# Patient Record
Sex: Female | Born: 2000 | Race: Black or African American | Hispanic: No | Marital: Single | Smoking: Never smoker
Health system: Southern US, Community
[De-identification: ages and names within clinical notes are randomized; demographics above are authoritative.]

## PROBLEM LIST (undated history)

## (undated) DIAGNOSIS — F419 Anxiety disorder, unspecified: Secondary | ICD-10-CM

## (undated) DIAGNOSIS — K219 Gastro-esophageal reflux disease without esophagitis: Secondary | ICD-10-CM

## (undated) DIAGNOSIS — T7840XA Allergy, unspecified, initial encounter: Secondary | ICD-10-CM

## (undated) DIAGNOSIS — F32A Depression, unspecified: Secondary | ICD-10-CM

## (undated) DIAGNOSIS — J302 Other seasonal allergic rhinitis: Secondary | ICD-10-CM

## (undated) DIAGNOSIS — D649 Anemia, unspecified: Secondary | ICD-10-CM

## (undated) DIAGNOSIS — J45909 Unspecified asthma, uncomplicated: Secondary | ICD-10-CM

## (undated) HISTORY — DX: Unspecified asthma, uncomplicated: J45.909

## (undated) HISTORY — DX: Anxiety disorder, unspecified: F41.9

## (undated) HISTORY — DX: Gastro-esophageal reflux disease without esophagitis: K21.9

## (undated) HISTORY — PX: NO PAST SURGERIES: SHX2092

## (undated) HISTORY — DX: Allergy, unspecified, initial encounter: T78.40XA

## (undated) HISTORY — DX: Depression, unspecified: F32.A

## (undated) HISTORY — DX: Anemia, unspecified: D64.9

---
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
## 2020-01-11 ENCOUNTER — Encounter: Payer: Self-pay | Admitting: Family Medicine

## 2020-01-26 DIAGNOSIS — H1045 Other chronic allergic conjunctivitis: Secondary | ICD-10-CM | POA: Diagnosis not present

## 2020-01-26 DIAGNOSIS — J301 Allergic rhinitis due to pollen: Secondary | ICD-10-CM | POA: Diagnosis not present

## 2020-01-26 DIAGNOSIS — J4520 Mild intermittent asthma, uncomplicated: Secondary | ICD-10-CM | POA: Diagnosis not present

## 2020-01-26 DIAGNOSIS — J3081 Allergic rhinitis due to animal (cat) (dog) hair and dander: Secondary | ICD-10-CM | POA: Diagnosis not present

## 2020-02-15 ENCOUNTER — Other Ambulatory Visit: Payer: Self-pay | Admitting: Family Medicine

## 2020-02-15 DIAGNOSIS — B373 Candidiasis of vulva and vagina: Secondary | ICD-10-CM

## 2020-02-15 DIAGNOSIS — B3731 Acute candidiasis of vulva and vagina: Secondary | ICD-10-CM

## 2020-02-15 NOTE — Telephone Encounter (Signed)
Requested medications are due for refill today?  Medication not assigned to a protocol.  Medication not on active medication list.    Requested medications are on active medication list?  No  Last Refill:   Last prescribed 3 months ago by Maurice Small, FNP  Future visit scheduled?  No   Notes to Clinic:

## 2020-02-19 NOTE — Telephone Encounter (Signed)
Please make appointment.

## 2020-02-23 ENCOUNTER — Ambulatory Visit (INDEPENDENT_AMBULATORY_CARE_PROVIDER_SITE_OTHER): Payer: Medicaid Other | Admitting: Family Medicine

## 2020-02-23 ENCOUNTER — Encounter: Payer: Self-pay | Admitting: Family Medicine

## 2020-02-23 ENCOUNTER — Other Ambulatory Visit (HOSPITAL_COMMUNITY)
Admission: RE | Admit: 2020-02-23 | Discharge: 2020-02-23 | Disposition: A | Discharge status: Home / Self Care | Payer: Medicaid Other | Source: Ambulatory Visit | Attending: Family Medicine | Admitting: Family Medicine

## 2020-02-23 ENCOUNTER — Other Ambulatory Visit: Payer: Self-pay

## 2020-02-23 VITALS — BP 122/80 | HR 78 | Temp 98.1°F | Resp 14 | Ht 67.0 in | Wt 150.4 lb

## 2020-02-23 DIAGNOSIS — N3090 Cystitis, unspecified without hematuria: Secondary | ICD-10-CM

## 2020-02-23 DIAGNOSIS — N898 Other specified noninflammatory disorders of vagina: Secondary | ICD-10-CM | POA: Diagnosis not present

## 2020-02-23 LAB — POCT URINALYSIS DIPSTICK
Bilirubin, UA: NEGATIVE
Blood, UA: NEGATIVE
Glucose, UA: NEGATIVE
Ketones, UA: NEGATIVE
Leukocytes, UA: NEGATIVE
Nitrite, UA: NEGATIVE
Odor: NORMAL
Protein, UA: NEGATIVE
Spec Grav, UA: 1.015 (ref 1.010–1.025)
Urobilinogen, UA: 0.2 E.U./dL
pH, UA: 6 (ref 5.0–8.0)

## 2020-02-23 LAB — POCT URINE PREGNANCY: Preg Test, Ur: NEGATIVE

## 2020-02-23 NOTE — Patient Instructions (Signed)
Bacterial Vaginosis  Bacterial vaginosis is a vaginal infection that occurs when the normal balance of bacteria in the vagina is disrupted. It results from an overgrowth of certain bacteria. This is the most common vaginal infection among women ages 15-44. Because bacterial vaginosis increases your risk for STIs (sexually transmitted infections), getting treated can help reduce your risk for chlamydia, gonorrhea, herpes, and HIV (human immunodeficiency virus). Treatment is also important for preventing complications in pregnant women, because this condition can cause an early (premature) delivery. What are the causes? This condition is caused by an increase in harmful bacteria that are normally present in small amounts in the vagina. However, the reason that the condition develops is not fully understood. What increases the risk? The following factors may make you more likely to develop this condition:  Having a new sexual partner or multiple sexual partners.  Having unprotected sex.  Douching.  Having an intrauterine device (IUD).  Smoking.  Drug and alcohol abuse.  Taking certain antibiotic medicines.  Being pregnant. You cannot get bacterial vaginosis from toilet seats, bedding, swimming pools, or contact with objects around you. What are the signs or symptoms? Symptoms of this condition include:  Grey or Springsteen vaginal discharge. The discharge can also be watery or foamy.  A fish-like odor with discharge, especially after sexual intercourse or during menstruation.  Itching in and around the vagina.  Burning or pain with urination. Some women with bacterial vaginosis have no signs or symptoms. How is this diagnosed? This condition is diagnosed based on:  Your medical history.  A physical exam of the vagina.  Testing a sample of vaginal fluid under a microscope to look for a large amount of bad bacteria or abnormal cells. Your health care provider may use a cotton swab or  a small wooden spatula to collect the sample. How is this treated? This condition is treated with antibiotics. These may be given as a pill, a vaginal cream, or a medicine that is put into the vagina (suppository). If the condition comes back after treatment, a second round of antibiotics may be needed. Follow these instructions at home: Medicines  Take over-the-counter and prescription medicines only as told by your health care provider.  Take or use your antibiotic as told by your health care provider. Do not stop taking or using the antibiotic even if you start to feel better. General instructions  If you have a female sexual partner, tell her that you have a vaginal infection. She should see her health care provider and be treated if she has symptoms. If you have a female sexual partner, he does not need treatment.  During treatment: ? Avoid sexual activity until you finish treatment. ? Do not douche. ? Avoid alcohol as directed by your health care provider. ? Avoid breastfeeding as directed by your health care provider.  Drink enough water and fluids to keep your urine clear or pale yellow.  Keep the area around your vagina and rectum clean. ? Wash the area daily with warm water. ? Wipe yourself from front to back after using the toilet.  Keep all follow-up visits as told by your health care provider. This is important. How is this prevented?  Do not douche.  Wash the outside of your vagina with warm water only.  Use protection when having sex. This includes latex condoms and dental dams.  Limit how many sexual partners you have. To help prevent bacterial vaginosis, it is best to have sex with just one partner (  monogamous).  Make sure you and your sexual partner are tested for STIs.  Wear cotton or cotton-lined underwear.  Avoid wearing tight pants and pantyhose, especially during summer.  Limit the amount of alcohol that you drink.  Do not use any products that contain  nicotine or tobacco, such as cigarettes and e-cigarettes. If you need help quitting, ask your health care provider.  Do not use illegal drugs. Where to find more information  Centers for Disease Control and Prevention: www.cdc.gov/std  American Sexual Health Association (ASHA): www.ashastd.org  U.S. Department of Health and Human Services, Office on Women's Health: www.womenshealth.gov/ or https://www.womenshealth.gov/a-z-topics/bacterial-vaginosis Contact a health care provider if:  Your symptoms do not improve, even after treatment.  You have more discharge or pain when urinating.  You have a fever.  You have pain in your abdomen.  You have pain during sex.  You have vaginal bleeding between periods. Summary  Bacterial vaginosis is a vaginal infection that occurs when the normal balance of bacteria in the vagina is disrupted.  Because bacterial vaginosis increases your risk for STIs (sexually transmitted infections), getting treated can help reduce your risk for chlamydia, gonorrhea, herpes, and HIV (human immunodeficiency virus). Treatment is also important for preventing complications in pregnant women, because the condition can cause an early (premature) delivery.  This condition is treated with antibiotic medicines. These may be given as a pill, a vaginal cream, or a medicine that is put into the vagina (suppository). This information is not intended to replace advice given to you by your health care provider. Make sure you discuss any questions you have with your health care provider. Document Revised: 09/20/2017 Document Reviewed: 06/23/2016 Elsevier Patient Education  2020 Elsevier Inc. Vaginitis  Vaginitis is irritation and swelling (inflammation) of the vagina. It happens when normal bacteria and yeast in the vagina grow too much. There are many types of this condition. Treatment will depend on the type you have. Follow these instructions at home: Lifestyle  Keep  your vagina area clean and dry. ? Avoid using soap. ? Rinse the area with water.  Do not do the following until your doctor says it is okay: ? Wash and clean out the vagina (douche). ? Use tampons. ? Have sex.  Wipe from front to back after going to the bathroom.  Let air reach your vagina. ? Wear cotton underwear. ? Do not wear:  Underwear while you sleep.  Tight pants.  Thong underwear.  Underwear or nylons without a cotton panel. ? Take off any wet clothing, such as bathing suits, as soon as possible.  Use gentle, non-scented products. Do not use things that can irritate the vagina, such as fabric softeners. Avoid the following products if they are scented: ? Feminine sprays. ? Detergents. ? Tampons. ? Feminine hygiene products. ? Soaps or bubble baths.  Practice safe sex and use condoms. General instructions  Take over-the-counter and prescription medicines only as told by your doctor.  If you were prescribed an antibiotic medicine, take or use it as told by your doctor. Do not stop taking or using the antibiotic even if you start to feel better.  Keep all follow-up visits as told by your doctor. This is important. Contact a doctor if:  You have pain in your belly.  You have a fever.  Your symptoms last for more than 2-3 days. Get help right away if:  You have a fever and your symptoms get worse all of a sudden. Summary  Vaginitis is irritation and   swelling of the vagina. It can happen when the normal bacteria and yeast in the vagina grow too much. There are many types.  Treatment will depend on the type you have.  Do not douche, use tampons , or have sex until your health care provider approves. When you can return to sex, practice safe sex and use condoms. This information is not intended to replace advice given to you by your health care provider. Make sure you discuss any questions you have with your health care provider. Document Revised: 09/20/2017  Document Reviewed: 10/30/2016 Elsevier Patient Education  2020 Elsevier Inc.  

## 2020-02-23 NOTE — Progress Notes (Signed)
Patient ID: Melinda Snyder, female    DOB: [birth year], 19 y.o.   MRN: [id number]  PCP: Hubbard Hartshorn, FNP  Chief Complaint  Patient presents with  . Vaginal Discharge    onset 3 weeks yellow discharge and odor    Subjective:   Melinda Snyder is a 19 y.o. female, presents to clinic with CC of the following:  HPI   Pt presents for 3 weeks vaginal discharge, discharge volume did not change, discharge changed color turned yellow and had an odor She also had slightly darker urine.    No OTC supplements or AZO  Urine pregnancy and urine dip done today- both negative, pt has no urinary sx, denies fever chills, sweats, N, V, D, back pain, abd pain Results for orders placed or performed in visit on 02/23/20  POCT urinalysis dipstick  Result Value Ref Range   Color, UA yellow    Clarity, UA clear    Glucose, UA Negative Negative   Bilirubin, UA neg    Ketones, UA neg    Spec Grav, UA 1.015 1.010 - 1.025   Blood, UA neg    pH, UA 6.0 5.0 - 8.0   Protein, UA Negative Negative   Urobilinogen, UA 0.2 0.2 or 1.0 E.U./dL   Nitrite, UA neg    Leukocytes, UA Negative Negative   Appearance clear    Odor normal   POCT urine pregnancy  Result Value Ref Range   Preg Test, Ur Negative Negative     Hx and HPI from OV 10/19/2019:  Pt presents with concern for vaginal odor - intermittent for about 5 months now.  She also notes occasional vaginal discharge - yellow and thick at times.  She notes that she tried drinking apple cider vinegar, cranberry juice, avoiding panty liners, and wearing loose clothing to bed did help some.  She has never been sexually active.  She denies any urinary symptoms, no abdominal pain, fevers/chills, N/V.        Patient Active Problem List   Diagnosis Date Noted  . Allergic rhinitis due to allergen 11/21/2018  . Asthma without status asthmaticus 11/21/2018      Current Outpatient Medications:  .  albuterol (PROAIR HFA) 108 (90 Base) MCG/ACT  inhaler, INHALE 2 PUFFS EVERY 4 HOURS AS NEEDED, Disp: , Rfl:  .  azelastine (ASTELIN) 0.1 % nasal spray, 1-2 PUFF IN EACH NOSTRIL TWICE A DAY NASALLY 30 DAY(S), Disp: , Rfl:  .  levocetirizine (XYZAL) 5 MG tablet, TAKE 1 TABLET BY MOUTH EVERY DAY IN THE EVENING, Disp: , Rfl:  .  TRI-LO-MARZIA 0.18/0.215/0.25 MG-25 MCG tab, TAKE 1 TABLET BY MOUTH EVERY DAY, Disp: 84 tablet, Rfl: 3   Allergies  Allergen Reactions  . Grass Extracts [Gramineae Pollens]   . Tree Extract      Family History  Problem Relation Age of Onset  . Hypertension Mother   . Iron deficiency Mother   . Heart disease Father   . Kidney disease Father      Social History   Socioeconomic History  . Marital status: Single    Spouse name: Not on file  . Number of children: Not on file  . Years of education: 66  . Highest education level: Not on file  Occupational History  . Occupation: full time student  Tobacco Use  . Smoking status: Never Smoker  . Smokeless tobacco: Never Used  Substance and Sexual Activity  . Alcohol use: No  . Drug use:  No  . Sexual activity: Not Currently    Partners: Male    Birth control/protection: Pill  Other Topics Concern  . Not on file  Social History Narrative  . Not on file   Social Determinants of Health   Financial Resource Strain:   . Difficulty of Paying Living Expenses:   Food Insecurity:   . Worried About Programme researcher, broadcasting/film/video in the Last Year:   . Barista in the Last Year:   Transportation Needs:   . Freight forwarder (Medical):   Marland Kitchen Lack of Transportation (Non-Medical):   Physical Activity:   . Days of Exercise per Week:   . Minutes of Exercise per Session:   Stress:   . Feeling of Stress :   Social Connections:   . Frequency of Communication with Friends and Family:   . Frequency of Social Gatherings with Friends and Family:   . Attends Religious Services:   . Active Member of Clubs or Organizations:   . Attends Banker  Meetings:   Marland Kitchen Marital Status:   Intimate Partner Violence:   . Fear of Current or Ex-Partner:   . Emotionally Abused:   Marland Kitchen Physically Abused:   . Sexually Abused:     Chart Review Today: I personally reviewed active problem list, medication list, allergies, family history, social history, health maintenance, notes from last encounter, lab results, imaging with the patient/caregiver today.   Review of Systems 10 Systems reviewed and are negative for acute change except as noted in the HPI.     Objective:   Vitals:   02/23/20 0902  BP: 122/80  Pulse: 78  Resp: 14  Temp: 98.1 F (36.7 C)  SpO2: 98%  Weight: 150 lb 6.4 oz (68.2 kg)  Height: 5\' 7"  (1.702 m)    Body mass index is 23.56 kg/m.  Physical Exam  GEN- well appearing, NAD, alert, well developed, well nourished,  HEENT- no conjunctival injection, no scleral icterus, external ears normal b/l Neck- Supple, normal phonation, normal ROM CVS- RRR, no M, G, R, no palpable thrill, symmetrical pulses 2+ radial, no LE edema bilaterally RESP- normal chest expansion, symetrrical, no respiratory distress, no tachypnea, CTA A&P no wheeze, rhonchi, rales ABD- normal appearance, normal BS x 4, abdomen soft NTND, no guarding, no rebound, CVA tenderness  GU- refused MSK- no deformity, no joint swelling, grossly intact ROM EXT- No rash, edema, erythema, wounds, lesions Neuro - Alert, normal gait, no tremor, normal coordination Psych - normal mood, normal affect, good eye contact, normal speech    Results for orders placed or performed in visit on 02/23/20  POCT urinalysis dipstick  Result Value Ref Range   Color, UA yellow    Clarity, UA clear    Glucose, UA Negative Negative   Bilirubin, UA neg    Ketones, UA neg    Spec Grav, UA 1.015 1.010 - 1.025   Blood, UA neg    pH, UA 6.0 5.0 - 8.0   Protein, UA Negative Negative   Urobilinogen, UA 0.2 0.2 or 1.0 E.U./dL   Nitrite, UA neg    Leukocytes, UA Negative Negative    Appearance clear    Odor normal   POCT urine pregnancy  Result Value Ref Range   Preg Test, Ur Negative Negative        Assessment & Plan:    1. Vaginal discharge Scant new discharge over the past couple months, not much volume and no vaginitis sx/irritation.  Doubt  PID, will tx based off lab results and if pt symptomatic. - POCT urinalysis dipstick - POCT urine pregnancy - Cervicovaginal ancillary only  2. Vaginal odor Scant discharge and odor - possibly BV, but not bothersome, discussed physiological vaginal discharge and indications to tx - POCT urinalysis dipstick - POCT urine pregnancy - Cervicovaginal ancillary only  3. Cystitis Mild suprapubic tenderness on exam, no urinary sx and dip neg, check urine culture - Urine Culture     Danelle Berry, PA-C 02/23/20 9:26 AM

## 2020-02-24 LAB — CERVICOVAGINAL ANCILLARY ONLY
Bacterial Vaginitis (gardnerella): NEGATIVE
Candida Glabrata: NEGATIVE
Candida Vaginitis: NEGATIVE
Chlamydia: NEGATIVE
Comment: NEGATIVE
Comment: NEGATIVE
Comment: NEGATIVE
Comment: NEGATIVE
Comment: NEGATIVE
Comment: NORMAL
Neisseria Gonorrhea: NEGATIVE
Trichomonas: NEGATIVE

## 2020-02-24 LAB — URINE CULTURE
MICRO NUMBER:: 10438470
Result:: NO GROWTH
SPECIMEN QUALITY:: ADEQUATE

## 2020-03-17 DIAGNOSIS — Z23 Encounter for immunization: Secondary | ICD-10-CM | POA: Diagnosis not present

## 2020-04-14 DIAGNOSIS — Z23 Encounter for immunization: Secondary | ICD-10-CM | POA: Diagnosis not present

## 2020-04-21 DIAGNOSIS — Z419 Encounter for procedure for purposes other than remedying health state, unspecified: Secondary | ICD-10-CM | POA: Diagnosis not present

## 2020-05-22 DIAGNOSIS — Z419 Encounter for procedure for purposes other than remedying health state, unspecified: Secondary | ICD-10-CM | POA: Diagnosis not present

## 2020-06-11 DIAGNOSIS — Z20822 Contact with and (suspected) exposure to covid-19: Secondary | ICD-10-CM | POA: Diagnosis not present

## 2020-06-11 DIAGNOSIS — K222 Esophageal obstruction: Secondary | ICD-10-CM | POA: Diagnosis not present

## 2020-06-16 NOTE — Progress Notes (Deleted)
Patient is a 19 year old female Last visit here was with Danelle Berry in May Follows up today with migraine headache concerns.  Looking through her chart upon review, she has not been seen recently for headaches or migraine concerns, and is not in her active problem list.

## 2020-06-17 ENCOUNTER — Ambulatory Visit: Payer: Medicaid Other | Admitting: Internal Medicine

## 2020-06-22 DIAGNOSIS — Z419 Encounter for procedure for purposes other than remedying health state, unspecified: Secondary | ICD-10-CM | POA: Diagnosis not present

## 2020-07-22 DIAGNOSIS — Z419 Encounter for procedure for purposes other than remedying health state, unspecified: Secondary | ICD-10-CM | POA: Diagnosis not present

## 2020-08-22 DIAGNOSIS — Z419 Encounter for procedure for purposes other than remedying health state, unspecified: Secondary | ICD-10-CM | POA: Diagnosis not present

## 2020-08-29 DIAGNOSIS — M7522 Bicipital tendinitis, left shoulder: Secondary | ICD-10-CM | POA: Diagnosis not present

## 2020-08-29 DIAGNOSIS — S46212A Strain of muscle, fascia and tendon of other parts of biceps, left arm, initial encounter: Secondary | ICD-10-CM | POA: Diagnosis not present

## 2020-09-07 DIAGNOSIS — H5213 Myopia, bilateral: Secondary | ICD-10-CM | POA: Diagnosis not present

## 2020-09-21 DIAGNOSIS — Z419 Encounter for procedure for purposes other than remedying health state, unspecified: Secondary | ICD-10-CM | POA: Diagnosis not present

## 2020-09-23 DIAGNOSIS — H5203 Hypermetropia, bilateral: Secondary | ICD-10-CM | POA: Diagnosis not present

## 2020-10-22 DIAGNOSIS — Z419 Encounter for procedure for purposes other than remedying health state, unspecified: Secondary | ICD-10-CM | POA: Diagnosis not present

## 2020-11-03 ENCOUNTER — Telehealth: Payer: Self-pay | Admitting: Emergency Medicine

## 2020-11-03 DIAGNOSIS — Z3041 Encounter for surveillance of contraceptive pills: Secondary | ICD-10-CM

## 2020-11-03 MED ORDER — NORGESTIM-ETH ESTRAD TRIPHASIC 0.18/0.215/0.25 MG-25 MCG PO TABS: 1.0000 | ORAL_TABLET | Freq: Every day | ORAL | 1 refills | Status: DC

## 2020-11-04 NOTE — Telephone Encounter (Signed)
lvm for pt to call the office and schedule an appt 

## 2020-11-07 ENCOUNTER — Ambulatory Visit (INDEPENDENT_AMBULATORY_CARE_PROVIDER_SITE_OTHER): Payer: Medicaid Other | Admitting: Internal Medicine

## 2020-11-07 DIAGNOSIS — B349 Viral infection, unspecified: Secondary | ICD-10-CM

## 2020-11-07 NOTE — Progress Notes (Signed)
Name: Melinda Snyder   MRN: [id number]    DOB: 17-Nov-2000   Date:11/07/2020       Progress Note  Subjective  Chief Complaint  Chief Complaint  Patient presents with  . Covid Symptoms    Cough, headache, body aches, chills, and nausea. Onset 11/06/2020.    I connected with  KARSTYN BIRKEY on 11/07/20 at  2:00 PM EST by telephone and verified that I am speaking with the correct person using two identifiers.  I discussed the limitations, risks, security and privacy concerns of performing an evaluation and management service by telephone and the availability of in person appointments. The patient expressed understanding and agreed to proceed. Staff also discussed with the patient that there may be a patient responsible charge related to this service. Patient Location: Home Provider Location: [facility] Additional Individuals present: none  HPI Patient is a 20 y.o female Present with above complaints Had exposures - mom has it, lives at home with her, has 2 sisters with sx's  Has test scheduled for this Thursday at [facility]  Covid imm status - had vaccine X 2, no booster  Sx' started - Sat  + cough, on and off, + production at times and whitish mucus No marked SOB + fever, not take temp, feeling feverish - chills + sore throat.  + mild congestion, +PND - + whitish mucus No loss of smell, loss of taste + N/V, vomited Sat am the last time + muscle aches, + body aches No marked loose stools/diarrhea No CP,  passing out episodes Has tried robitussin, tylenol, allergy med which is zyrtec and singulair   Tob - never smoker Comorbid conditions reviewed + asthma - no daily meds for asthma, has albuterol to use prn, has Advair just prescribed by allergist and uses prn (noted usually told to take daily and not taking like that). Notes asthma not worse presently, no wheezing or SOB  Works at [employer],     Patient Active Problem List   Diagnosis Date Noted  . Allergic rhinitis due to allergen  11/21/2018  . Asthma without status asthmaticus 11/21/2018    Past Surgical History:  Procedure Laterality Date  . NO PAST SURGERIES      Family History  Problem Relation Age of Onset  . Hypertension Mother   . Iron deficiency Mother   . Heart disease Father   . Kidney disease Father     Social History   Tobacco Use  . Smoking status: Never Smoker  . Smokeless tobacco: Never Used  Substance Use Topics  . Alcohol use: No     Current Outpatient Medications:  .  albuterol (VENTOLIN HFA) 108 (90 Base) MCG/ACT inhaler, INHALE 2 PUFFS EVERY 4 HOURS AS NEEDED, Disp: , Rfl:  .  azelastine (ASTELIN) 0.1 % nasal spray, 1-2 PUFF IN EACH NOSTRIL TWICE A DAY NASALLY 30 DAY(S), Disp: , Rfl:  .  levocetirizine (XYZAL) 5 MG tablet, TAKE 1 TABLET BY MOUTH EVERY DAY IN THE EVENING, Disp: , Rfl:  .  Norgestimate-Ethinyl Estradiol Triphasic (TRI-LO-MARZIA) 0.18/0.215/0.25 MG-25 MCG tab, Take 1 tablet by mouth daily., Disp: 84 tablet, Rfl: 1  Allergies  Allergen Reactions  . Grass Extracts [Gramineae Pollens]   . Tree Extract     With staff assistance, above reviewed with the patient today.  ROS: As per HPI, otherwise no specific complaints on a limited and focused system review   Objective  Virtual encounter, vitals not obtained.  There is no height or weight  on file to calculate BMI.  Physical Exam   Appears in NAD via conversation Pulmonary/Chest: No obvious respiratory distress. Speaking in complete sentences Neurological: Pt is alert and oriented, Speech is normal Psychiatric: Patient has a normal mood and affect, behavior is normal. Judgment and thought content normal.   No results found for this or any previous visit (from the past 72 hour(s)).  PHQ2/9: Depression screen [facility] 2/9 11/07/2020 02/23/2020 10/19/2019 06/24/2019 05/05/2019  Decreased Interest 0 0 0 0 0  Down, Depressed, Hopeless 0 0 0 0 0  PHQ - 2 Score 0 0 0 0 0  Altered sleeping - 0 0 0 -  Tired, decreased  energy - 0 1 0 -  Change in appetite - 0 1 0 -  Feeling bad or failure about yourself  - 0 1 0 -  Trouble concentrating - 0 1 0 -  Moving slowly or fidgety/restless - 0 0 0 -  Suicidal thoughts - 0 0 0 -  PHQ-9 Score - 0 4 0 -  Difficult doing work/chores - Not difficult at all - - -   PHQ-2/9 Result reviewed  Fall Risk: Fall Risk  11/07/2020 02/23/2020 10/19/2019 06/24/2019 05/05/2019  Falls in the past year? 0 0 0 0 0  Number falls in past yr: 0 0 0 0 0  Injury with Fall? 0 0 0 0 0  Follow up - - Falls evaluation completed - -     Assessment & Plan  1. Viral syndrome Discussed with patient at length the likelihood that she is positive for COVID noting the exposure history, with mom having COVID and her living in the same household.  She has a test scheduled for Thursday, and noted we may be able to get that sooner through [facility], at the earliest tomorrow afternoon, and will ask our staff to call her if that is a possibility pending staffing that is present due to limitations in getting there with weather issues presently. Noted the treatment options available for COVID presently, although in very limited supply and available almost predominantly to higher risk individuals based on age and comorbidities. Felt best to proceed as follows: Continue with symptomatic measures, and emphasized the importance of staying hydrated, Also continue Tylenol products as needed, and would use a Mucinex or Robitussin type product as an expectorant as needed in the short-term. Recommended she use her Advair inhaler twice daily as is recommended routinely, as can be helpful currently with her COVID illness, as it does have a steroid entity in the medicine in addition to a longer acting form of albuterol.  Also can use her albuterol inhaler as needed in the short-term as well. Discussed return to work issues with her, and she should not return to work until symptoms significantly improved/resolved, and  cannot return to work as we are awaiting the result of a COVID test.  Hoping we can get that test off tomorrow, although did note it does take a couple days at least for that to come back. Noted if her COVID test returns positive, her management likely will not change, and she should not return to work until symptoms have significantly improved/almost resolved, and if the COVID test is positive, she needs to wear a mask for at least 7 days after returning to work.  Should remain isolated until symptoms are much improved.  Also emphasized close monitoring, and if symptoms not improving or more problematic as the week is progressing, she needs to follow-up.  Also noted if  symptoms significantly worsen, with increased shortness of breath, higher fevers, chest pains, more vomiting and or passing out concerns, she needs to be seen more urgently in an ER setting, and she was understanding of that  I discussed the assessment and treatment plan with the patient. The patient was provided an opportunity to ask questions and all were answered. The patient agreed with the plan and demonstrated an understanding of the instructions.  Red flags and when to present for emergency care or RTC including fever >101.14F, chest pain, shortness of breath, new/worsening/un-resolving symptoms reviewed with patient at time of visit.   The patient was advised to call back or seek an in-person evaluation if the symptoms worsen or if the condition fails to improve as anticipated.  I provided 20 minutes of non-face-to-face time during this encounter that included discussing at length patient's sx/history, pertinent pmhx, medications, treatment and follow up plan. This time also included the necessary documentation, orders, and chart review.  Jamelle Haring, MD

## 2020-11-08 ENCOUNTER — Telehealth: Payer: Self-pay

## 2020-11-08 ENCOUNTER — Other Ambulatory Visit: Payer: Self-pay | Admitting: Emergency Medicine

## 2020-11-08 DIAGNOSIS — Z20822 Contact with and (suspected) exposure to covid-19: Secondary | ICD-10-CM

## 2020-11-08 NOTE — Telephone Encounter (Signed)
Spoke with patient to see if she could by the office between 12 and 1 today to be tested for Covid.  Pt stated that she would have to call back once she could figure out her transportation.

## 2020-11-08 NOTE — Telephone Encounter (Signed)
Pt is calling back and will be at office between 12 and 1 pm today

## 2020-11-10 LAB — NOVEL CORONAVIRUS, NAA: SARS-CoV-2, NAA: DETECTED — AB

## 2020-11-10 LAB — SARS-COV-2, NAA 2 DAY TAT

## 2020-11-22 ENCOUNTER — Ambulatory Visit (INDEPENDENT_AMBULATORY_CARE_PROVIDER_SITE_OTHER): Payer: Medicaid Other | Admitting: Family Medicine

## 2020-11-22 ENCOUNTER — Other Ambulatory Visit: Payer: Self-pay

## 2020-11-22 ENCOUNTER — Encounter: Payer: Self-pay | Admitting: Family Medicine

## 2020-11-22 VITALS — BP 110/78 | HR 100 | Temp 98.2°F | Resp 20 | Ht 67.0 in | Wt 147.6 lb

## 2020-11-22 DIAGNOSIS — Z419 Encounter for procedure for purposes other than remedying health state, unspecified: Secondary | ICD-10-CM | POA: Diagnosis not present

## 2020-11-22 DIAGNOSIS — J4520 Mild intermittent asthma, uncomplicated: Secondary | ICD-10-CM

## 2020-11-22 DIAGNOSIS — J302 Other seasonal allergic rhinitis: Secondary | ICD-10-CM

## 2020-11-22 DIAGNOSIS — Z3041 Encounter for surveillance of contraceptive pills: Secondary | ICD-10-CM

## 2020-11-22 DIAGNOSIS — N926 Irregular menstruation, unspecified: Secondary | ICD-10-CM

## 2020-11-22 MED ORDER — FLUTICASONE-SALMETEROL 100-50 MCG/DOSE IN AEPB: 1.0000 | INHALATION_SPRAY | Freq: Two times a day (BID) | RESPIRATORY_TRACT | 5 refills | Status: DC

## 2020-11-22 MED ORDER — LEVOCETIRIZINE DIHYDROCHLORIDE 5 MG PO TABS: 5.0000 mg | ORAL_TABLET | Freq: Every evening | ORAL | 1 refills | Status: DC

## 2020-11-22 MED ORDER — MONTELUKAST SODIUM 10 MG PO TABS: 10.0000 mg | ORAL_TABLET | Freq: Every day | ORAL | 1 refills | Status: DC

## 2020-11-22 MED ORDER — AZELASTINE HCL 0.1 % NA SOLN
2.0000 | Freq: Two times a day (BID) | NASAL | 2 refills | Status: DC
Start: 2020-11-22 — End: 2021-02-07

## 2020-11-22 MED ORDER — FLUTICASONE PROPIONATE 50 MCG/ACT NA SUSP: 2.0000 | Freq: Every day | NASAL | 2 refills | Status: DC

## 2020-11-22 NOTE — Progress Notes (Signed)
   11/22/20 1130  Asthma History  Symptoms 0-2 days/week  Nighttime Awakenings 0-2/month  Asthma interference with normal activity Some limitations  SABA use (not for EIB) 0-2 days/wk  Risk: Exacerbations requiring oral systemic steroids 0-1 / year  Asthma Severity Mild Persistent

## 2020-11-22 NOTE — Progress Notes (Signed)
Name: Melinda Snyder   MRN: [id number]    DOB: 07/04/2001   Date:11/22/2020       Progress Note  Chief Complaint  Patient presents with  . Follow-up     Subjective:   Melinda Snyder is a 20 y.o. female, presents to clinic for routine f/up  Asthma and allergies- managing with astelin, xyzal, advair (but taking prn) and using rescue inhaler In the last couple weeks she restarted advair BID, has not been using rescue inhaler often, gets symptomatic at work in [employer] and when doing more physical work, no nighttime waking No longer seeing allergy/asthma specialists In the past singulair really helped with both allergies and asthma - not currently taking, no SE or concerns in the past She ran out of nasal sprays at the pharmacy was on two - asteline and flonase   11/22/20 1130  Asthma History  Symptoms 0-2 days/week  Nighttime Awakenings 0-2/month  Asthma interference with normal activity Some limitations  SABA use (not for EIB) 0-2 days/wk  Risk: Exacerbations requiring oral systemic steroids 0-1 / year  Asthma Severity Mild Persistent   Sx with exercise/physical activity, some limitations, no waking, no recent exacerbation or need for steroids She thinks she is on zyrtec and not xyzal  Recently had COVID URI sx, sx started on 11/05/2020, tested positive on 1/18, isolation would have ended about a week ago.  All sx resolved, most of her family was ill too and have all seemed to recover  Pt here also for OCP f/up/monitoring On OCP pill On same triphasic birth control pill for several years, started as a teenager at [facility] and established care here 11/21/2018 with Maurice Small, on OCP for helping with irregular menses.  She has normal cycles, bleeding is light, no SE or concerns with OCP, no heavy bleeding, cramping or break through bleeding, good compliance, no missed doses, not currently sexually active.  No hx of clotting disorders, nonsmoker, no hx of  HTN     Current Outpatient Medications:  .  albuterol (VENTOLIN HFA) 108 (90 Base) MCG/ACT inhaler, INHALE 2 PUFFS EVERY 4 HOURS AS NEEDED, Disp: , Rfl:  .  azelastine (ASTELIN) 0.1 % nasal spray, 1-2 PUFF IN EACH NOSTRIL TWICE A DAY NASALLY 30 DAY(S), Disp: , Rfl:  .  levocetirizine (XYZAL) 5 MG tablet, TAKE 1 TABLET BY MOUTH EVERY DAY IN THE EVENING, Disp: , Rfl:  .  Norgestimate-Ethinyl Estradiol Triphasic (TRI-LO-MARZIA) 0.18/0.215/0.25 MG-25 MCG tab, Take 1 tablet by mouth daily., Disp: 84 tablet, Rfl: 1  Patient Active Problem List   Diagnosis Date Noted  . Allergic rhinitis due to allergen 11/21/2018  . Asthma without status asthmaticus 11/21/2018    Past Surgical History:  Procedure Laterality Date  . NO PAST SURGERIES      Family History  Problem Relation Age of Onset  . Hypertension Mother   . Iron deficiency Mother   . Heart disease Father   . Kidney disease Father     Social History   Tobacco Use  . Smoking status: Never Smoker  . Smokeless tobacco: Never Used  Vaping Use  . Vaping Use: Never used  Substance Use Topics  . Alcohol use: No  . Drug use: No     Allergies  Allergen Reactions  . Grass Extracts [Gramineae Pollens]   . Tree Extract     Health Maintenance  Topic Date Due  . Hepatitis C Screening  Never done  . TETANUS/TDAP  Never done  .  INFLUENZA VACCINE  05/22/2020  . HIV Screening  Completed    Chart Review Today: I personally reviewed active problem list, medication list, allergies, family history, social history, health maintenance, notes from last encounter, lab results, imaging with the patient/caregiver today.   Review of Systems  Constitutional: Negative.   HENT: Negative.   Eyes: Negative.   Respiratory: Negative.   Cardiovascular: Negative.  Negative for chest pain, palpitations and leg swelling.  Gastrointestinal: Negative.   Endocrine: Negative.   Genitourinary: Negative.   Musculoskeletal: Negative.   Skin:  Negative.   Allergic/Immunologic: Negative.   Neurological: Negative.   Hematological: Negative.   Psychiatric/Behavioral: Negative.   All other systems reviewed and are negative.    Objective:   Vitals:   11/22/20 1115  BP: 110/78  Pulse: 100  Resp: 20  Temp: 98.2 F (36.8 C)  TempSrc: Oral  SpO2: 99%  Weight: 147 lb 9.6 oz (67 kg)  Height: 5\' 7"  (1.702 m)    Body mass index is 23.12 kg/m.  Physical Exam Vitals and nursing note reviewed.  Constitutional:      General: She is not in acute distress.    Appearance: Normal appearance. She is well-developed and normal weight. She is not ill-appearing, toxic-appearing or diaphoretic.     Interventions: Face mask in place.  HENT:     Head: Normocephalic and atraumatic.     Right Ear: External ear normal.     Left Ear: External ear normal.  Eyes:     General: Lids are normal. No scleral icterus.       Right eye: No discharge.        Left eye: No discharge.     Conjunctiva/sclera: Conjunctivae normal.  Neck:     Trachea: Phonation normal. No tracheal deviation.  Cardiovascular:     Rate and Rhythm: Normal rate and regular rhythm.     Pulses: Normal pulses.          Radial pulses are 2+ on the right side and 2+ on the left side.       Posterior tibial pulses are 2+ on the right side and 2+ on the left side.     Heart sounds: Normal heart sounds. No murmur heard. No friction rub. No gallop.   Pulmonary:     Effort: Pulmonary effort is normal. No respiratory distress.     Breath sounds: Normal breath sounds. No stridor. No wheezing, rhonchi or rales.  Chest:     Chest wall: No tenderness.  Abdominal:     General: Bowel sounds are normal. There is no distension.     Palpations: Abdomen is soft.  Musculoskeletal:     Right lower leg: No edema.     Left lower leg: No edema.  Skin:    General: Skin is warm and dry.     Coloration: Skin is not jaundiced or pale.     Findings: No rash.  Neurological:     Mental  Status: She is alert.     Motor: No abnormal muscle tone.     Gait: Gait normal.  Psychiatric:        Mood and Affect: Mood normal.        Speech: Speech normal.        Behavior: Behavior normal.         Assessment & Plan:     ICD-10-CM   1. Mild intermittent asthma without status asthmaticus without complication  J45.20    continue advair BID, allergy meds,  add back singulair, continue albuterol rescue inhaler PRN and before any exercise   2. Seasonal allergic rhinitis, unspecified trigger  J30.2    continue daily antihistamine, add back singulair, continue steroid and antihistamine nasal sprays  3. Encounter for surveillance of contraceptive pills  Z30.41    good compliance, not currently sexually active, pt low risk, discussed SE and monitoring, BP at goal today, due for CPE, refused STD screening today  4. Irregular menses  N92.6    well controlled with OCP- she has been on same pill for several years, no concerns   Due for immunizations but since she just recovered from COVID, pt asked to come back in about 2 weeks for needed vaccinations:  Health Maintenance  Topic Date Due  . INFLUENZA VACCINE  12/06/2020 (Originally 05/22/2020)  . TETANUS/TDAP  12/06/2020 (Originally 02/12/2020)  . Hepatitis C Screening  11/22/2021 (Originally 10/27/2000)  . HIV Screening  Completed      Return in about 6 months (around 05/22/2021) for Annual Physical.   Danelle Berry, PA-C 11/22/20 11:22 AM

## 2020-11-25 ENCOUNTER — Emergency Department
Admission: EM | Admit: 2020-11-25 | Discharge: 2020-11-25 | Disposition: A | Discharge status: Home / Self Care | Payer: Medicaid Other | Attending: Emergency Medicine | Admitting: Emergency Medicine

## 2020-11-25 ENCOUNTER — Emergency Department: Payer: Medicaid Other

## 2020-11-25 ENCOUNTER — Other Ambulatory Visit: Payer: Self-pay

## 2020-11-25 ENCOUNTER — Encounter: Payer: Self-pay | Admitting: Emergency Medicine

## 2020-11-25 DIAGNOSIS — J45909 Unspecified asthma, uncomplicated: Secondary | ICD-10-CM | POA: Diagnosis not present

## 2020-11-25 DIAGNOSIS — Z7951 Long term (current) use of inhaled steroids: Secondary | ICD-10-CM | POA: Insufficient documentation

## 2020-11-25 DIAGNOSIS — N390 Urinary tract infection, site not specified: Secondary | ICD-10-CM | POA: Diagnosis not present

## 2020-11-25 DIAGNOSIS — M25551 Pain in right hip: Secondary | ICD-10-CM | POA: Diagnosis not present

## 2020-11-25 LAB — URINALYSIS, COMPLETE (UACMP) WITH MICROSCOPIC
Bacteria, UA: NONE SEEN
Bilirubin Urine: NEGATIVE
Glucose, UA: NEGATIVE mg/dL
Hgb urine dipstick: NEGATIVE
Ketones, ur: 5 mg/dL — AB
Nitrite: NEGATIVE
Protein, ur: 100 mg/dL — AB
Specific Gravity, Urine: 1.041 — ABNORMAL HIGH (ref 1.005–1.030)
pH: 5 (ref 5.0–8.0)

## 2020-11-25 MED ORDER — NAPROXEN 500 MG PO TABS: 500.0000 mg | ORAL_TABLET | Freq: Two times a day (BID) | ORAL | 0 refills | Status: DC

## 2020-11-25 MED ORDER — KETOROLAC TROMETHAMINE 30 MG/ML IJ SOLN
30.0000 mg | Freq: Once | INTRAMUSCULAR | Status: AC
  Administered 2020-11-25: 30 mg via INTRAMUSCULAR
  Filled 2020-11-25: qty 1

## 2020-11-25 MED ORDER — NITROFURANTOIN MONOHYD MACRO 100 MG PO CAPS: 100.0000 mg | ORAL_CAPSULE | Freq: Two times a day (BID) | ORAL | 0 refills | Status: AC

## 2020-11-25 NOTE — ED Provider Notes (Signed)
[facility] Emergency Department Provider Note  ____________________________________________   Event Date/Time   First MD Initiated Contact with Patient 11/25/20 [phone]     (approximate)  I have reviewed the triage vital signs and the nursing notes.   HISTORY  Chief Complaint Leg Pain   HPI Melinda Snyder is a 20 y.o. female presents to the ED with complaint of right hip pain that started yesterday without known history of injury.  Patient states that she currently works for [employer] and does a lot of lifting but does not recall an injury.  She has been taking Tylenol and ibuprofen without any relief of her pain.  She denies any previous injury to her hip area.  She states that pain is present with weightbearing but not with sitting or lying supine.  Currently she rates her pain as 6 out of 10.       Past Medical History:  Diagnosis Date  . Allergy   . Seasonal allergies     Patient Active Problem List   Diagnosis Date Noted  . Allergic rhinitis due to allergen 11/21/2018  . Asthma without status asthmaticus 11/21/2018    Past Surgical History:  Procedure Laterality Date  . NO PAST SURGERIES      Prior to Admission medications   Medication Sig Start Date End Date Taking? Authorizing Provider  naproxen (NAPROSYN) 500 MG tablet Take 1 tablet (500 mg total) by mouth 2 (two) times daily with a meal. 11/25/20  Yes Bridget Hartshorn L, PA-C  nitrofurantoin, macrocrystal-monohydrate, (MACROBID) 100 MG capsule Take 1 capsule (100 mg total) by mouth 2 (two) times daily for 7 days. 11/25/20 12/02/20 Yes Summers, Rhonda L, PA-C  albuterol (VENTOLIN HFA) 108 (90 Base) MCG/ACT inhaler INHALE 2 PUFFS EVERY 4 HOURS AS NEEDED 12/13/15   [provider]  azelastine (ASTELIN) 0.1 % nasal spray Place 2 sprays into both nostrils 2 (two) times daily. Use in each nostril as directed 11/22/20   Danelle Berry, PA-C  fluticasone (FLONASE) 50 MCG/ACT nasal spray Place 2 sprays  into both nostrils daily. 11/22/20   Danelle Berry, PA-C  Fluticasone-Salmeterol (ADVAIR DISKUS) 100-50 MCG/DOSE AEPB Inhale 1 puff into the lungs in the morning and at bedtime. 11/22/20   Danelle Berry, PA-C  levocetirizine (XYZAL) 5 MG tablet Take 1 tablet (5 mg total) by mouth every evening. 11/22/20   Danelle Berry, PA-C  montelukast (SINGULAIR) 10 MG tablet Take 1 tablet (10 mg total) by mouth at bedtime. 11/22/20   Danelle Berry, PA-C  Norgestimate-Ethinyl Estradiol Triphasic (TRI-LO-MARZIA) 0.18/0.215/0.25 MG-25 MCG tab Take 1 tablet by mouth daily. 11/03/20   Danelle Berry, PA-C    Allergies Grass extracts [gramineae pollens] and Tree extract  Family History  Problem Relation Age of Onset  . Hypertension Mother   . Iron deficiency Mother   . Heart disease Father   . Kidney disease Father     Social History Social History   Tobacco Use  . Smoking status: Never Smoker  . Smokeless tobacco: Never Used  Vaping Use  . Vaping Use: Never used  Substance Use Topics  . Alcohol use: No  . Drug use: No    Review of Systems Constitutional: No fever/chills Eyes: No visual changes. ENT: No sore throat. Cardiovascular: Denies chest pain. Respiratory: Denies shortness of breath. Gastrointestinal: No abdominal pain.  No nausea, no vomiting.  No diarrhea. Genitourinary: Negative for dysuria. Musculoskeletal: Positive right hip pain. Skin: Negative for rash. Neurological: Negative for headaches, focal weakness  or numbness. ___________________________________________   PHYSICAL EXAM:  VITAL SIGNS: ED Triage Vitals  Enc Vitals Group     BP 11/25/20 0706 116/62     Pulse Rate 11/25/20 0706 79     Resp 11/25/20 0706 16     Temp 11/25/20 0706 98.6 F (37 C)     Temp Source 11/25/20 0706 Oral     SpO2 11/25/20 0706 98 %     Weight 11/25/20 0707 147 lb (66.7 kg)     Height 11/25/20 0707 5\' 7"  (1.702 m)     Head Circumference --      Peak Flow --      Pain Score 11/25/20 0711 6     Pain  Loc --      Pain Edu? --      Excl. in GC? --     Constitutional: Alert and oriented. Well appearing and in no acute distress. Eyes: Conjunctivae are normal. PERRL. EOMI. Head: Atraumatic. Neck: No stridor.   Cardiovascular: Normal rate, regular rhythm. Grossly normal heart sounds.  Good peripheral circulation. Respiratory: Normal respiratory effort.  No retractions. Lungs CTAB. Gastrointestinal: Soft and nontender. No distention. No CVA tenderness. Musculoskeletal: No point tenderness is noted on palpation of the thoracic or lumbar spine.  No sacral tenderness.  Patient has some tenderness on palpation of the right hip laterally.  Range of motion does not reproduce her pain but is reproduced with standing.  No warmth or discoloration noted in the area.  No soft tissue edema present. Neurologic:  Normal speech and language. No gross focal neurologic deficits are appreciated.  Skin:  Skin is warm, dry and intact.  No abrasions or erythema present. Psychiatric: Mood and affect are normal. Speech and behavior are normal.  ____________________________________________   LABS (all labs ordered are listed, but only abnormal results are displayed)  Labs Reviewed  URINALYSIS, COMPLETE (UACMP) WITH MICROSCOPIC - Abnormal; Notable for the following components:      Result Value   Color, Urine AMBER (*)    APPearance CLOUDY (*)    Specific Gravity, Urine 1.041 (*)    Ketones, ur 5 (*)    Protein, ur 100 (*)    Leukocytes,Ua SMALL (*)    All other components within normal limits  POC URINE PREG, ED    RADIOLOGY I, 01/23/21, personally viewed and evaluated these images (plain radiographs) as part of my medical decision making, as well as reviewing the written report by the radiologist.   Official radiology report(s): DG Hip Unilat W or Wo Pelvis 2-3 Views Right  Result Date: 11/25/2020 CLINICAL DATA:  Right hip pain without known injury. EXAM: DG HIP (WITH OR WITHOUT PELVIS) 2-3V  RIGHT COMPARISON:  None. FINDINGS: There is no evidence of hip fracture or dislocation. There is no evidence of arthropathy or other focal bone abnormality. IMPRESSION: Negative. Electronically Signed   By: 01/23/2021 M.D.   On: 11/25/2020 08:57    ____________________________________________   PROCEDURES  Procedure(s) performed (including Critical Care):  Procedures   ____________________________________________   INITIAL IMPRESSION / ASSESSMENT AND PLAN / ED COURSE  As part of my medical decision making, I reviewed the following data within the electronic MEDICAL RECORD NUMBER Notes from prior ED visits and [place] Controlled Substance Database  20 year old female presents to the ED with complaint of right hip pain that began yesterday.  Patient states that there is been no injury.  She currently works at [employer] but no injury occurred  there.  Urinalysis was suspicious for a early UTI.  X-rays of the right hip were negative for any acute bony changes.  Patient has been taking Tylenol and ibuprofen at home home without any relief.  She was given Toradol 30 mg IM and a prescription for naproxen 500 mg twice daily with food.  Also prescription for Macrodantin was sent to her pharmacy twice daily for the next 7 days.  Patient is encouraged to increase fluids.  She is to follow-up with her PCP if any continued problems.  ____________________________________________   FINAL CLINICAL IMPRESSION(S) / ED DIAGNOSES  Final diagnoses:  Acute hip pain, right  Acute urinary tract infection     ED Discharge Orders         Ordered    nitrofurantoin, macrocrystal-monohydrate, (MACROBID) 100 MG capsule  2 times daily        11/25/20 0931    naproxen (NAPROSYN) 500 MG tablet  2 times daily with meals        11/25/20 0931          *Please note:  HAILLEY BYERS was evaluated in Emergency Department on 11/25/2020 for the symptoms described in the history of present illness. She was  evaluated in the context of the global COVID-19 pandemic, which necessitated consideration that the patient might be at risk for infection with the SARS-CoV-2 virus that causes COVID-19. Institutional protocols and algorithms that pertain to the evaluation of patients at risk for COVID-19 are in a state of rapid change based on information released by regulatory bodies including the CDC and federal and state organizations. These policies and algorithms were followed during the patient's care in the ED.  Some ED evaluations and interventions may be delayed as a result of limited staffing during and the pandemic.*   Note:  This document was prepared using Dragon voice recognition software and may include unintentional dictation errors.    Tommi Rumps, PA-C 11/25/20 1319    Gilles Chiquito, MD 11/25/20 [phone]

## 2020-11-25 NOTE — ED Notes (Signed)
POC  Urine preg  Negative

## 2020-11-25 NOTE — ED Notes (Signed)
See triage note  Presents with pain from right hip area into right leg  States pain started yesterday  Denies any injury  Ambulates well  No urinary sx's

## 2020-11-25 NOTE — Discharge Instructions (Addendum)
Follow-up with your primary care provider if any continued problems.  Increase fluids.  Begin taking antibiotics until completely finished.  Be aware that while taking an antibiotic that birth control is less effective and you may want to use other forms of contraceptives while taking the antibiotic until your next.  Take naproxen twice a day with food.  You may take Tylenol with this medication if additional pain medication is needed.

## 2020-11-25 NOTE — ED Triage Notes (Signed)
Pt to ED via POV stating that she is having right leg pain that started last night around 1800. Pt denies any known injury to the area. Pt is in NAD.

## 2020-12-10 ENCOUNTER — Other Ambulatory Visit: Payer: Self-pay

## 2020-12-10 ENCOUNTER — Emergency Department
Admission: EM | Admit: 2020-12-10 | Discharge: 2020-12-10 | Disposition: A | Discharge status: Home / Self Care | Payer: Medicaid Other | Attending: Emergency Medicine | Admitting: Emergency Medicine

## 2020-12-10 DIAGNOSIS — R109 Unspecified abdominal pain: Secondary | ICD-10-CM | POA: Insufficient documentation

## 2020-12-10 DIAGNOSIS — Z5321 Procedure and treatment not carried out due to patient leaving prior to being seen by health care provider: Secondary | ICD-10-CM | POA: Diagnosis not present

## 2020-12-10 DIAGNOSIS — K5900 Constipation, unspecified: Secondary | ICD-10-CM | POA: Diagnosis not present

## 2020-12-10 DIAGNOSIS — R1031 Right lower quadrant pain: Secondary | ICD-10-CM | POA: Diagnosis not present

## 2020-12-10 DIAGNOSIS — R1084 Generalized abdominal pain: Secondary | ICD-10-CM | POA: Diagnosis not present

## 2020-12-10 LAB — COMPREHENSIVE METABOLIC PANEL
ALT: 11 U/L (ref 0–44)
AST: 14 U/L — ABNORMAL LOW (ref 15–41)
Albumin: 3.9 g/dL (ref 3.5–5.0)
Alkaline Phosphatase: 59 U/L (ref 38–126)
Anion gap: 8 (ref 5–15)
BUN: 12 mg/dL (ref 6–20)
CO2: 23 mmol/L (ref 22–32)
Calcium: 8.7 mg/dL — ABNORMAL LOW (ref 8.9–10.3)
Chloride: 106 mmol/L (ref 98–111)
Creatinine, Ser: 0.82 mg/dL (ref 0.44–1.00)
GFR, Estimated: >60 mL/min (ref >60)
Glucose, Bld: 92 mg/dL (ref 70–99)
Potassium: 3.9 mmol/L (ref 3.5–5.1)
Sodium: 137 mmol/L (ref 135–145)
Total Bilirubin: 0.6 mg/dL (ref 0.3–1.2)
Total Protein: 7.4 g/dL (ref 6.5–8.1)

## 2020-12-10 LAB — LIPASE, BLOOD: Lipase: 33 U/L (ref 11–51)

## 2020-12-10 LAB — CBC
HCT: 33.3 % — ABNORMAL LOW (ref 36.0–46.0)
Hemoglobin: 9.7 g/dL — ABNORMAL LOW (ref 12.0–15.0)
MCH: 21 pg — ABNORMAL LOW (ref 26.0–34.0)
MCHC: 29.1 g/dL — ABNORMAL LOW (ref 30.0–36.0)
MCV: 72.2 fL — ABNORMAL LOW (ref 80.0–100.0)
Platelets: 305 10*3/uL (ref 150–400)
RBC: 4.61 MIL/uL (ref 3.87–5.11)
RDW: 17.2 % — ABNORMAL HIGH (ref 11.5–15.5)
WBC: 6 10*3/uL (ref 4.0–10.5)
nRBC: 0 % (ref 0.0–0.2)

## 2020-12-10 LAB — URINALYSIS, COMPLETE (UACMP) WITH MICROSCOPIC
Bacteria, UA: NONE SEEN
Bilirubin Urine: NEGATIVE
Glucose, UA: NEGATIVE mg/dL
Ketones, ur: 5 mg/dL — AB
Leukocytes,Ua: NEGATIVE
Nitrite: NEGATIVE
Protein, ur: 100 mg/dL — AB
RBC / HPF: >50 RBC/hpf — ABNORMAL HIGH (ref 0–5)
Specific Gravity, Urine: 1.036 — ABNORMAL HIGH (ref 1.005–1.030)
pH: 5 (ref 5.0–8.0)

## 2020-12-10 LAB — POC URINE PREG, ED: Preg Test, Ur: NEGATIVE

## 2020-12-10 NOTE — ED Triage Notes (Signed)
Pt presents to ER c/o mid-abdominal pain that started suddenly around 2200 tonight.  Pt denies doing anything when it started.  Pt denies n/v/d at this time.  Abdomen tender on palpation.  Last BM unknown at this time.

## 2020-12-20 DIAGNOSIS — Z419 Encounter for procedure for purposes other than remedying health state, unspecified: Secondary | ICD-10-CM | POA: Diagnosis not present

## 2021-01-20 DIAGNOSIS — Z419 Encounter for procedure for purposes other than remedying health state, unspecified: Secondary | ICD-10-CM | POA: Diagnosis not present

## 2021-01-26 ENCOUNTER — Other Ambulatory Visit: Payer: Self-pay | Admitting: Allergy

## 2021-01-26 ENCOUNTER — Ambulatory Visit
Admission: RE | Admit: 2021-01-26 | Discharge: 2021-01-26 | Disposition: A | Discharge status: Home / Self Care | Payer: Medicaid Other | Source: Ambulatory Visit | Attending: Allergy | Admitting: Allergy

## 2021-01-26 DIAGNOSIS — R0602 Shortness of breath: Secondary | ICD-10-CM | POA: Diagnosis not present

## 2021-01-26 DIAGNOSIS — J301 Allergic rhinitis due to pollen: Secondary | ICD-10-CM | POA: Diagnosis not present

## 2021-01-26 DIAGNOSIS — R059 Cough, unspecified: Secondary | ICD-10-CM | POA: Diagnosis not present

## 2021-01-26 DIAGNOSIS — J45909 Unspecified asthma, uncomplicated: Secondary | ICD-10-CM | POA: Insufficient documentation

## 2021-01-26 DIAGNOSIS — H1045 Other chronic allergic conjunctivitis: Secondary | ICD-10-CM | POA: Diagnosis not present

## 2021-01-26 DIAGNOSIS — J3081 Allergic rhinitis due to animal (cat) (dog) hair and dander: Secondary | ICD-10-CM | POA: Diagnosis not present

## 2021-01-26 DIAGNOSIS — J4520 Mild intermittent asthma, uncomplicated: Secondary | ICD-10-CM | POA: Diagnosis not present

## 2021-02-07 ENCOUNTER — Other Ambulatory Visit: Payer: Self-pay

## 2021-02-07 DIAGNOSIS — J4520 Mild intermittent asthma, uncomplicated: Secondary | ICD-10-CM

## 2021-02-07 DIAGNOSIS — J302 Other seasonal allergic rhinitis: Secondary | ICD-10-CM

## 2021-02-07 MED ORDER — AZELASTINE HCL 0.1 % NA SOLN: 2.0000 | Freq: Two times a day (BID) | NASAL | 2 refills | Status: DC

## 2021-02-19 DIAGNOSIS — Z419 Encounter for procedure for purposes other than remedying health state, unspecified: Secondary | ICD-10-CM | POA: Diagnosis not present

## 2021-02-20 DIAGNOSIS — Z03818 Encounter for observation for suspected exposure to other biological agents ruled out: Secondary | ICD-10-CM | POA: Diagnosis not present

## 2021-02-20 DIAGNOSIS — Z20822 Contact with and (suspected) exposure to covid-19: Secondary | ICD-10-CM | POA: Diagnosis not present

## 2021-02-24 DIAGNOSIS — U071 COVID-19: Secondary | ICD-10-CM | POA: Diagnosis not present

## 2021-02-24 DIAGNOSIS — J00 Acute nasopharyngitis [common cold]: Secondary | ICD-10-CM | POA: Diagnosis not present

## 2021-03-02 DIAGNOSIS — Z03818 Encounter for observation for suspected exposure to other biological agents ruled out: Secondary | ICD-10-CM | POA: Diagnosis not present

## 2021-03-02 DIAGNOSIS — Z20822 Contact with and (suspected) exposure to covid-19: Secondary | ICD-10-CM | POA: Diagnosis not present

## 2021-03-22 ENCOUNTER — Other Ambulatory Visit: Payer: Self-pay | Admitting: Family Medicine

## 2021-03-22 DIAGNOSIS — Z419 Encounter for procedure for purposes other than remedying health state, unspecified: Secondary | ICD-10-CM | POA: Diagnosis not present

## 2021-04-06 DIAGNOSIS — M79675 Pain in left toe(s): Secondary | ICD-10-CM | POA: Diagnosis not present

## 2021-04-06 DIAGNOSIS — S90112A Contusion of left great toe without damage to nail, initial encounter: Secondary | ICD-10-CM | POA: Diagnosis not present

## 2021-04-06 DIAGNOSIS — B351 Tinea unguium: Secondary | ICD-10-CM | POA: Diagnosis not present

## 2021-04-16 ENCOUNTER — Other Ambulatory Visit: Payer: Self-pay | Admitting: Family Medicine

## 2021-04-16 DIAGNOSIS — Z3041 Encounter for surveillance of contraceptive pills: Secondary | ICD-10-CM

## 2021-04-21 DIAGNOSIS — Z419 Encounter for procedure for purposes other than remedying health state, unspecified: Secondary | ICD-10-CM | POA: Diagnosis not present

## 2021-05-19 ENCOUNTER — Other Ambulatory Visit: Payer: Self-pay | Admitting: Family Medicine

## 2021-05-23 ENCOUNTER — Other Ambulatory Visit (HOSPITAL_COMMUNITY)
Admission: RE | Admit: 2021-05-23 | Discharge: 2021-05-23 | Disposition: A | Discharge status: Home / Self Care | Payer: Medicaid Other | Source: Ambulatory Visit | Attending: Family Medicine | Admitting: Family Medicine

## 2021-05-23 ENCOUNTER — Other Ambulatory Visit: Payer: Self-pay

## 2021-05-23 ENCOUNTER — Ambulatory Visit: Payer: Medicaid Other | Admitting: Family Medicine

## 2021-05-23 ENCOUNTER — Encounter: Payer: Self-pay | Admitting: Family Medicine

## 2021-05-23 VITALS — BP 118/80 | HR 99 | Temp 98.1°F | Resp 16 | Ht 67.0 in | Wt 150.2 lb

## 2021-05-23 DIAGNOSIS — Z23 Encounter for immunization: Secondary | ICD-10-CM | POA: Diagnosis not present

## 2021-05-23 DIAGNOSIS — F419 Anxiety disorder, unspecified: Secondary | ICD-10-CM

## 2021-05-23 DIAGNOSIS — Z0001 Encounter for general adult medical examination with abnormal findings: Secondary | ICD-10-CM | POA: Diagnosis not present

## 2021-05-23 DIAGNOSIS — N926 Irregular menstruation, unspecified: Secondary | ICD-10-CM

## 2021-05-23 DIAGNOSIS — Z833 Family history of diabetes mellitus: Secondary | ICD-10-CM | POA: Diagnosis not present

## 2021-05-23 DIAGNOSIS — Z832 Family history of diseases of the blood and blood-forming organs and certain disorders involving the immune mechanism: Secondary | ICD-10-CM

## 2021-05-23 DIAGNOSIS — Z0000 Encounter for general adult medical examination without abnormal findings: Secondary | ICD-10-CM | POA: Diagnosis not present

## 2021-05-23 DIAGNOSIS — J302 Other seasonal allergic rhinitis: Secondary | ICD-10-CM

## 2021-05-23 DIAGNOSIS — Z113 Encounter for screening for infections with a predominantly sexual mode of transmission: Secondary | ICD-10-CM | POA: Insufficient documentation

## 2021-05-23 DIAGNOSIS — Z114 Encounter for screening for human immunodeficiency virus [HIV]: Secondary | ICD-10-CM

## 2021-05-23 DIAGNOSIS — Z3041 Encounter for surveillance of contraceptive pills: Secondary | ICD-10-CM

## 2021-05-23 DIAGNOSIS — J4520 Mild intermittent asthma, uncomplicated: Secondary | ICD-10-CM

## 2021-05-23 DIAGNOSIS — D649 Anemia, unspecified: Secondary | ICD-10-CM

## 2021-05-23 MED ORDER — LEVOCETIRIZINE DIHYDROCHLORIDE 5 MG PO TABS: 5.0000 mg | ORAL_TABLET | Freq: Every evening | ORAL | 1 refills | Status: DC

## 2021-05-23 MED ORDER — FLUTICASONE-SALMETEROL 100-50 MCG/ACT IN AEPB: 1.0000 | INHALATION_SPRAY | Freq: Two times a day (BID) | RESPIRATORY_TRACT | 11 refills | Status: DC

## 2021-05-23 NOTE — Patient Instructions (Signed)
Preventive Care 18-21 Years Old, Female Preventive care refers to lifestyle choices and visits with your health care provider that can promote health and wellness. At this stage in your life, you may start seeing a primary care physician instead of a pediatrician. It is important to take responsibility for your health and well-being. Preventive care for young adults includes: A yearly physical exam. This is also called an annual wellness visit. Regular dental and eye exams. Immunizations. Screening for certain conditions. Healthy lifestyle choices, such as: Eating a healthy diet. Getting regular exercise. Not using drugs or products that contain nicotine and tobacco. Limiting alcohol use. What can I expect for my preventive care visit? Physical exam Your health care provider may check your: Height and weight. These may be used to calculate your BMI (body mass index). BMI is a measurement that tells if you are at a healthy weight. Heart rate and blood pressure. Body temperature. Skin for abnormal spots. Counseling Your health care provider may ask you questions about your: Past medical problems. Family's medical history. Alcohol, tobacco, and drug use. Home life and relationship well-being. Access to firearms. Emotional well-being. Diet, exercise, and sleep habits. Sexual activity and sexual health. Method of birth control. Menstrual cycle. Pregnancy history. What immunizations do I need? Vaccines are usually given at various ages, according to a schedule. Your health care provider will recommend vaccines for you based on your age, medicalhistory, and lifestyle or other factors, such as travel or where you work. What tests do I need? Blood tests Lipid and cholesterol levels. These may be checked every 5 years starting at age 20. Hepatitis C test. Hepatitis B test. Screening Pelvic exam and Pap test. This may be done every 3 years starting at age 21. STD (sexually transmitted  disease) testing, if you are at risk. BRCA-related cancer screening. This may be done if you have a family history of breast, ovarian, tubal, or peritoneal cancers. Other tests Tuberculosis skin test. Vision and hearing tests. Skin exam. Breast exam. Talk with your health care provider about your test results, treatment options,and if necessary, the need for more tests. Follow these instructions at home: Eating and drinking Eat a healthy diet that includes fresh fruits and vegetables, whole grains, lean protein, and low-fat dairy products. Drink enough fluid to keep your urine pale yellow. Do not drink alcohol if: Your health care provider tells you not to drink. You are pregnant, may be pregnant, or are planning to become pregnant. You are under the legal drinking age. In the [place] the legal drinking age is 21. If you drink alcohol: Limit how much you use to 0-1 drink a day. Be aware of how much alcohol is in your drink. In the [place] one drink equals one 12 oz bottle of beer (355 mL), one 5 oz glass of wine (148 mL), or one 1 oz glass of hard liquor (44 mL).  Lifestyle Take daily care of your teeth and gums. Brush your teeth every morning and night with fluoride toothpaste. Floss one time each day. Stay active. Exercise for at least 30 minutes 5 or more days of the week. Do not use any products that contain nicotine or tobacco, such as cigarettes, e-cigarettes, and chewing tobacco. If you need help quitting, ask your health care provider. Do not use drugs. If you are sexually active, practice safe sex. Use a condom or other form of protection to prevent STIs (sexually transmitted infections). If you do not wish to become pregnant, use a form   of birth control. If you plan to become pregnant, see your health care provider for a prepregnancy visit. Find healthy ways to cope with stress, such as: Meditation, yoga, or listening to music. Journaling. Talking to a trusted person. Spending  time with friends and family. Safety Always wear your seat belt while driving or riding in a vehicle. Do not drive: If you have been drinking alcohol. Do not ride with someone who has been drinking. When you are tired or distracted. While texting. Wear a helmet and other protective equipment during sports activities. If you have firearms in your house, make sure you follow all gun safety procedures. Seek help if you have been bullied, physically abused, or sexually abused. Use the Internet responsibly to avoid dangers, such as online bullying and online sex predators. What's next? Go to your health care provider once a year for an annual wellness visit. Ask your health care provider how often you should have your eyes and teeth checked. Stay up to date on all vaccines. This information is not intended to replace advice given to you by your health care provider. Make sure you discuss any questions you have with your healthcare provider. Document Revised: 06/05/2020 Document Reviewed: 10/02/2018 Elsevier Patient Education  2022 [employer]  

## 2021-05-23 NOTE — Progress Notes (Signed)
Adolescent Well Care Visit Melinda Snyder is a 20 y.o. female who is here for well care.    PCP:  Danelle Berry, PA-C   History was provided by the patient.   Current Issues: Current concerns include none -  Needs refills on ashtma meds - well controlled Using albuterol rescue inhaler very rarely No nighttime waking  Sometimes asthma interferes   Nutrition: Nutrition/Eating Behaviors:  balanced healthy, eats fruits smoothies, bowl of fruit, fruit salad Adequate calcium in diet?:  no dairy  Supplements/ Vitamins: none   Exercise/ Media: Play any Sports?/ Exercise:  physical work with job  Screen Time:  > 2 hours-counseling provided Higher education careers adviser or Monitoring?:   Sleep:  Sleep: 4-5 hours of sleep, can't sleep lately, tries sleep hygiene but not helping much   Social Screening: Lives with:  mom brother and 2 sisters Parental relations:  some stress with siblings, stress and tension in her home that keeps her a little on edge and prevents her from falling asleep   Education: Not in school right now  Menstruation:   Patient's last menstrual period was 05/09/2021. Menstrual History: on OCP - working well Not sexually active - last time was a year ago  Confidential Social History: Tobacco?  no Secondhand smoke exposure?  no Drugs/ETOH?  no  Sexually Active?  no   Pregnancy Prevention: yes see above  Safe at home, in school & in relationships?  Yes Safe to self?  Yes   Screenings: Patient has a dental home: wisdom tooth recently out, sees dentist regularly Has reading prescription lenses - sees eye doctor as well  Reviewed and discussed the following topics, anticipatory guidance and counseling reviewed:  healthy eating, exercise, seatbelt use, bullying, abuse/trauma, weapon use, tobacco use, marijuana use, drug use, condom use, birth control, sexuality, suicidality/self harm, mental health issues, social isolation, school problems, family problems, and screen time    PHQ-9 completed and results indicated no depression, no SI Depression screen [facility] 2/9 05/23/2021 11/22/2020 11/07/2020  Decreased Interest 1 0 0  Down, Depressed, Hopeless 1 0 0  PHQ - 2 Score 2 0 0  Altered sleeping 0 - -  Tired, decreased energy 0 - -  Change in appetite 0 - -  Feeling bad or failure about yourself  0 - -  Trouble concentrating 0 - -  Moving slowly or fidgety/restless 0 - -  Suicidal thoughts 0 - -  PHQ-9 Score 2 - -  Difficult doing work/chores Not difficult at all - -   Pt has trouble sleeping and is generally anxious GAD 7 : Generalized Anxiety Score 05/23/2021  Nervous, Anxious, on Edge 1  Control/stop worrying 1  Worry too much - different things 1  Trouble relaxing 0  Restless 0  Easily annoyed or irritable 2  Afraid - awful might happen 1  Total GAD 7 Score 6  Anxiety Difficulty Somewhat difficult       Physical Exam:  Vitals:   05/23/21 1002  BP: 118/80  Pulse: 99  Resp: 16  Temp: 98.1 F (36.7 C)  SpO2: 99%  Weight: 150 lb 3.2 oz (68.1 kg)  Height: 5\' 7"  (1.702 m)   BP 118/80   Pulse 99   Temp 98.1 F (36.7 C)   Resp 16   Ht 5\' 7"  (1.702 m)   Wt 150 lb 3.2 oz (68.1 kg)   LMP 05/09/2021   SpO2 99%   BMI 23.52 kg/m  Body mass index: body mass index is 23.52  kg/m.   Physical Exam Vitals and nursing note reviewed.  Constitutional:      General: She is not in acute distress.    Appearance: Normal appearance. She is well-developed and normal weight. She is not ill-appearing, toxic-appearing or diaphoretic.     Interventions: Face mask in place.  HENT:     Head: Normocephalic and atraumatic.     Right Ear: Tympanic membrane, ear canal and external ear normal. There is no impacted cerumen.     Left Ear: Tympanic membrane, ear canal and external ear normal.     Nose: Nose normal. No congestion or rhinorrhea.     Mouth/Throat:     Mouth: Mucous membranes are moist.     Pharynx: Oropharynx is clear. Uvula midline. No oropharyngeal  exudate or posterior oropharyngeal erythema.  Eyes:     General: Lids are normal. No scleral icterus.       Right eye: No discharge.        Left eye: No discharge.     Conjunctiva/sclera: Conjunctivae normal.     Pupils: Pupils are equal, round, and reactive to light.  Neck:     Trachea: Phonation normal. No tracheal deviation.  Cardiovascular:     Rate and Rhythm: Normal rate and regular rhythm.     Pulses: Normal pulses.          Radial pulses are 2+ on the right side and 2+ on the left side.       Posterior tibial pulses are 2+ on the right side and 2+ on the left side.     Heart sounds: Normal heart sounds. No murmur heard.   No friction rub. No gallop.  Pulmonary:     Effort: Pulmonary effort is normal. No respiratory distress.     Breath sounds: Normal breath sounds. No stridor. No wheezing, rhonchi or rales.  Chest:     Chest wall: No tenderness.  Abdominal:     General: Bowel sounds are normal. There is no distension.     Palpations: Abdomen is soft.     Tenderness: There is no abdominal tenderness. There is no guarding or rebound.  Musculoskeletal:        General: No deformity. Normal range of motion.     Cervical back: Normal range of motion and neck supple.     Right lower leg: No edema.     Left lower leg: No edema.  Lymphadenopathy:     Cervical: No cervical adenopathy.  Skin:    General: Skin is warm and dry.     Capillary Refill: Capillary refill takes less than 2 seconds.     Coloration: Skin is not jaundiced or pale.     Findings: No rash.  Neurological:     Mental Status: She is alert and oriented to person, place, and time.     Motor: No abnormal muscle tone.     Gait: Gait normal.  Psychiatric:        Mood and Affect: Mood normal.        Speech: Speech normal.        Behavior: Behavior normal.     Assessment and Plan:        ICD-10-CM   1. Encounter for general adult medical examination with abnormal findings  Z00.01     2. Annual physical  exam  Z00.00 HIV antibody (with reflex)    RPR    COMPLETE METABOLIC PANEL WITH GFR    CBC with Differential/Platelet    Cytology (  oral, anal, urethral) ancillary only    3. Screen for STD (sexually transmitted disease)  Z11.3 Cervicovaginal ancillary only    HIV antibody (with reflex)    RPR    COMPLETE METABOLIC PANEL WITH GFR    CBC with Differential/Platelet    Cytology (oral, anal, urethral) ancillary only    4. Need for Tdap vaccination  Z23 Tdap vaccine greater than or equal to 7yo IM    5. Need for HPV vaccination  Z23 HPV 9-valent vaccine,Recombinat    6. Screening for HIV without presence of risk factors  Z11.4 HIV antibody (with reflex)    Cytology (oral, anal, urethral) ancillary only    7. Family history of diabetes mellitus in mother  Z72.3 COMPLETE METABOLIC PANEL WITH GFR    8. Anemia, unspecified type  D64.9 CBC with Differential/Platelet   labs done with prior ED visit show microcytic anemia - will need to get f/up OV to further eval and discuss, no pallor on exam, asx    9. Family history of anemia  Z83.2 CBC with Differential/Platelet    10. Anxiety disorder, unspecified type  F41.9 CBC with Differential/Platelet   recommended counseling and considering SSRI     11. Encounter for surveillance of contraceptive pills  Z30.41 HIV antibody (with reflex)    RPR    Cytology (oral, anal, urethral) ancillary only    12. Irregular menses  N92.6 CBC with Differential/Platelet   better with OCP    13. Seasonal allergic rhinitis, unspecified trigger  J30.2 levocetirizine (XYZAL) 5 MG tablet   well controlled with current management - xyzal and singular    14. Mild intermittent asthma without status asthmaticus without complication  J45.20 fluticasone-salmeterol (ADVAIR) 100-50 MCG/ACT AEPB   stable, well controlled with advair and rescue inhaler       Counseling provided for the following    vaccine components  Orders Placed This Encounter  Procedures   Tdap  vaccine greater than or equal to 7yo IM   HPV 9-valent vaccine,Recombinat     Return for anemia and lab f/up in the next 1-2 months .Marland Kitchen  Danelle Berry, PA-C

## 2021-05-24 LAB — CERVICOVAGINAL ANCILLARY ONLY
Bacterial Vaginitis (gardnerella): NEGATIVE
Candida Glabrata: NEGATIVE
Candida Vaginitis: POSITIVE — AB
Chlamydia: NEGATIVE
Comment: NEGATIVE
Comment: NEGATIVE
Comment: NEGATIVE
Comment: NEGATIVE
Comment: NEGATIVE
Comment: NORMAL
Neisseria Gonorrhea: NEGATIVE
Trichomonas: NEGATIVE

## 2021-05-24 LAB — HIV ANTIBODY (ROUTINE TESTING W REFLEX): HIV 1&2 Ab, 4th Generation: NONREACTIVE

## 2021-05-24 LAB — CBC WITH DIFFERENTIAL/PLATELET
Absolute Monocytes: 832 cells/uL (ref 200–950)
Basophils Absolute: 54 cells/uL (ref 0–200)
Basophils Relative: 0.5 %
Eosinophils Absolute: 43 cells/uL (ref 15–500)
Eosinophils Relative: 0.4 %
HCT: 37.5 % (ref 35.0–45.0)
Hemoglobin: 11 g/dL — ABNORMAL LOW (ref 11.7–15.5)
Lymphs Abs: 4147 cells/uL — ABNORMAL HIGH (ref 850–3900)
MCH: 20.8 pg — ABNORMAL LOW (ref 27.0–33.0)
MCHC: 29.3 g/dL — ABNORMAL LOW (ref 32.0–36.0)
MCV: 70.8 fL — ABNORMAL LOW (ref 80.0–100.0)
MPV: 9.7 fL (ref 7.5–12.5)
Monocytes Relative: 7.7 %
Neutro Abs: 5724 cells/uL (ref 1500–7800)
Neutrophils Relative %: 53 %
Platelets: 442 10*3/uL — ABNORMAL HIGH (ref 140–400)
RBC: 5.3 10*6/uL — ABNORMAL HIGH (ref 3.80–5.10)
RDW: 17.7 % — ABNORMAL HIGH (ref 11.0–15.0)
Total Lymphocyte: 38.4 %
WBC: 10.8 10*3/uL (ref 3.8–10.8)

## 2021-05-24 LAB — COMPLETE METABOLIC PANEL WITH GFR
AG Ratio: 1.6 (calc) (ref 1.0–2.5)
ALT: 15 U/L (ref 6–29)
AST: 12 U/L (ref 10–30)
Albumin: 4.6 g/dL (ref 3.6–5.1)
Alkaline phosphatase (APISO): 58 U/L (ref 31–125)
BUN: 7 mg/dL (ref 7–25)
CO2: 27 mmol/L (ref 20–32)
Calcium: 9.6 mg/dL (ref 8.6–10.2)
Chloride: 102 mmol/L (ref 98–110)
Creat: 0.83 mg/dL (ref 0.50–0.96)
Globulin: 2.9 g/dL (calc) (ref 1.9–3.7)
Glucose, Bld: 74 mg/dL (ref 65–99)
Potassium: 4.1 mmol/L (ref 3.5–5.3)
Sodium: 138 mmol/L (ref 135–146)
Total Bilirubin: 0.5 mg/dL (ref 0.2–1.2)
Total Protein: 7.5 g/dL (ref 6.1–8.1)
eGFR: 103 mL/min/{1.73_m2} (ref >=60)

## 2021-05-24 LAB — RPR: RPR Ser Ql: NONREACTIVE

## 2021-06-01 ENCOUNTER — Ambulatory Visit: Payer: Self-pay | Admitting: *Deleted

## 2021-06-01 NOTE — Telephone Encounter (Signed)
C/o ongoing headaches x 1  month. C/o frontal and top of head pain. Thought headaches caused by wisdom teeth. Had wisdom teeth extracted and continues to have headaches. C/o headaches becoming more constant and not getting relief from tylenol or ibuprofen. Had to leave work early due to headache, dizziness, and nausea. Denies visual disturbances, N/T or weakness on either side of body. Denies vomiting with headache. Appt scheduled for  06/19/21. Encouraged patient to increase water inake. Care advise given. Patient verbalized understanding of care advise and to call back or go to [facility] or ED if symptoms worsen.

## 2021-06-01 NOTE — Telephone Encounter (Signed)
Reason for Disposition  [1] MILD-MODERATE headache AND [2] present > 72 hours  Answer Assessment - Initial Assessment Questions 1. LOCATION: "Where does it hurt?"      Frontal top area of head 2. ONSET: "When did the headache start?" (Minutes, hours or days)      Ongoing for atleast a month 3. PATTERN: "Does the pain come and go, or has it been constant since it started?"     More constant  4. SEVERITY: "How bad is the pain?" and "What does it keep you from doing?"  (e.g., Scale 1-10; mild, moderate, or severe)   - MILD (1-3): doesn't interfere with normal activities    - MODERATE (4-7): interferes with normal activities or awakens from sleep    - SEVERE (8-10): excruciating pain, unable to do any normal activities        Moderate interferes with sleep 5. RECURRENT SYMPTOM: "Have you ever had headaches before?" If Yes, ask: "When was the last time?" and "What happened that time?"      Several months ago now constant 6. CAUSE: "What do you think is causing the headache?"     Not sure thought it may be wisdom teeth. Had wisdom teeth extracted and still having headaches. 7. MIGRAINE: "Have you been diagnosed with migraine headaches?" If Yes, ask: "Is this headache similar?"      Na  8. HEAD INJURY: "Has there been any recent injury to the head?"      No  9. OTHER SYMPTOMS: "Do you have any other symptoms?" (fever, stiff neck, eye pain, sore throat, cold symptoms)     No  10. PREGNANCY: "Is there any chance you are pregnant?" "When was your last menstrual period?"       Na LMP beginning of August  Protocols used: [facility]

## 2021-06-02 NOTE — Telephone Encounter (Signed)
Spoke with patient to see in she went to [facility] or ER on yesterday since she was advised by triage. Patient stated she did not. I was able to get patient in with Dr. Linwood Dibbles on 06/05/21.  I also reiterated with patient to see care at the [facility] or ER if symptoms get worse.  Patient verbalized understanding.

## 2021-06-06 ENCOUNTER — Ambulatory Visit: Payer: Medicaid Other | Admitting: Family Medicine

## 2021-06-06 ENCOUNTER — Other Ambulatory Visit: Payer: Self-pay

## 2021-06-06 ENCOUNTER — Encounter: Payer: Self-pay | Admitting: Family Medicine

## 2021-06-06 DIAGNOSIS — R519 Headache, unspecified: Secondary | ICD-10-CM | POA: Diagnosis not present

## 2021-06-06 MED ORDER — SUMATRIPTAN SUCCINATE 25 MG PO TABS: 25.0000 mg | ORAL_TABLET | ORAL | 0 refills | Status: DC | PRN

## 2021-06-06 MED ORDER — METOCLOPRAMIDE HCL 10 MG PO TABS: 10.0000 mg | ORAL_TABLET | Freq: Once | ORAL | 0 refills | Status: DC

## 2021-06-06 NOTE — Patient Instructions (Signed)
It was great to see you!  Our plans for today:  - Take the metoclopramide with one pill of benadryl to help your headache now. This will make you sleepy, don't drive after taking.  - if you are still having pain after taking this, come back to see [place]. - Take the sumatriptan whenever you feel a future headache come on. It works best in the first 15 minutes of headache. - If you develop new or changing symptoms, especially vision changes, difficulties walking or talking, seek care immediately.   Take care and seek immediate care sooner if you develop any concerns.   Dr. Linwood Dibbles  Migraine Headache A migraine headache is an intense, throbbing pain on one side or both sides of the head. Migraine headaches may also cause other symptoms, such as nausea, vomiting, and sensitivity to light and noise. A migraine headache can last from 4 hours to 3 days. Talk with your doctor about what things may bring on (trigger) your migraine headaches. What are the causes? The exact cause of this condition is not known. However, a migraine may be caused when nerves in the brain become irritated and release chemicals that cause inflammation of blood vessels. This inflammation causes pain. This condition may be triggered or caused by: Drinking alcohol. Smoking. Taking medicines, such as: Medicine used to treat chest pain (nitroglycerin). Birth control pills. Estrogen. Certain blood pressure medicines. Eating or drinking products that contain nitrates, glutamate, aspartame, or tyramine. Aged cheeses, chocolate, or caffeine may also be triggers. Doing physical activity. Other things that may trigger a migraine headache include: Menstruation. Pregnancy. Hunger. Stress. Lack of sleep or too much sleep. Weather changes. Fatigue. What increases the risk? The following factors may make you more likely to experience migraine headaches: Being a certain age. This condition is more common in people who are 60-64  years old. Being female. Having a family history of migraine headaches. Being Caucasian. Having a mental health condition, such as depression or anxiety. Being obese. What are the signs or symptoms? The main symptom of this condition is pulsating or throbbing pain. This pain may: Happen in any area of the head, such as on one side or both sides. Interfere with daily activities. Get worse with physical activity. Get worse with exposure to bright lights or loud noises. Other symptoms may include: Nausea. Vomiting. Dizziness. General sensitivity to bright lights, loud noises, or smells. Before you get a migraine headache, you may get warning signs (an aura). An aura may include: Seeing flashing lights or having blind spots. Seeing bright spots, halos, or zigzag lines. Having tunnel vision or blurred vision. Having numbness or a tingling feeling. Having trouble talking. Having muscle weakness. Some people have symptoms after a migraine headache (postdromal phase), such as: Feeling tired. Difficulty concentrating. How is this diagnosed? A migraine headache can be diagnosed based on: Your symptoms. A physical exam. Tests, such as: CT scan or an MRI of the head. These imaging tests can help rule out other causes of headaches. Taking fluid from the spine (lumbar puncture) and analyzing it (cerebrospinal fluid analysis, or CSF analysis). How is this treated? This condition may be treated with medicines that: Relieve pain. Relieve nausea. Prevent migraine headaches. Treatment for this condition may also include: Acupuncture. Lifestyle changes like avoiding foods that trigger migraine headaches. Biofeedback. Cognitive behavioral therapy. Follow these instructions at home: Medicines Take over-the-counter and prescription medicines only as told by your health care provider. Ask your health care provider if the medicine prescribed  to you: Requires you to avoid driving or using heavy  machinery. Can cause constipation. You may need to take these actions to prevent or treat constipation: Drink enough fluid to keep your urine pale yellow. Take over-the-counter or prescription medicines. Eat foods that are high in fiber, such as beans, whole grains, and fresh fruits and vegetables. Limit foods that are high in fat and processed sugars, such as fried or sweet foods. Lifestyle Do not drink alcohol. Do not use any products that contain nicotine or tobacco, such as cigarettes, e-cigarettes, and chewing tobacco. If you need help quitting, ask your health care provider. Get at least 8 hours of sleep every night. Find ways to manage stress, such as meditation, deep breathing, or yoga. General instructions     Keep a journal to find out what may trigger your migraine headaches. For example, write down: What you eat and drink. How much sleep you get. Any change to your diet or medicines. If you have a migraine headache: Avoid things that make your symptoms worse, such as bright lights. It may help to lie down in a dark, quiet room. Do not drive or use heavy machinery. Ask your health care provider what activities are safe for you while you are experiencing symptoms. Keep all follow-up visits as told by your health care provider. This is important. Contact a health care provider if: You develop symptoms that are different or more severe than your usual migraine headache symptoms. You have more than 15 headache days in one month. Get help right away if: Your migraine headache becomes severe. Your migraine headache lasts longer than 72 hours. You have a fever. You have a stiff neck. You have vision loss. Your muscles feel weak or like you cannot control them. You start to lose your balance often. You have trouble walking. You faint. You have a seizure. Summary A migraine headache is an intense, throbbing pain on one side or both sides of the head. Migraines may also cause  other symptoms, such as nausea, vomiting, and sensitivity to light and noise. This condition may be treated with medicines and lifestyle changes. You may also need to avoid certain things that trigger a migraine headache. Keep a journal to find out what may trigger your migraine headaches. Contact your health care provider if you have more than 15 headache days in a month or you develop symptoms that are different or more severe than your usual migraine headache symptoms. This information is not intended to replace advice given to you by your health care provider. Make sure you discuss any questions you have with your healthcare provider. Document Revised: 01/30/2019 Document Reviewed: 11/20/2018 Elsevier Patient Education  2022 [employer].

## 2021-06-06 NOTE — Assessment & Plan Note (Signed)
Sx most consistent with migraine given mostly unilateral with radiation, with nausea, and +FH. Possible she has rebound or medication overuse contributing. Normal neuro exam. Will give reglan for immediate abortive therapy. Sumatriptan provided for future headaches. Advised to return if headache persists, changing or worsening symptoms. Emergency precautions discussed.

## 2021-06-06 NOTE — Progress Notes (Signed)
   SUBJECTIVE:   CHIEF COMPLAINT / HPI:   HEADACHE - every day for the past month, frontal, R side Duration: month Onset: gradual Quality: sharp and aching Frequency: constant Location: frontal Radiation: yes, down face, sometimes down in neck Alleviating factors: hydrocodone Aggravating factors: none Headache status at time of visit: current headache Treatments attempted: ice, APAP, and ibuprofen   Aura: no Nausea:  yes Vomiting: no Photophobia:  no Phonophobia:  no Effect on social functioning:  yes Numbers of missed days of work: 2 Confusion:  no Gait disturbance/ataxia:  no Behavioral changes:  no Fevers:  no Will wake with headache.  Mom with migraines. No caffeine use.  LMP 2 weeks ago.   OBJECTIVE:   BP 120/80   Pulse 93   Temp 98.4 F (36.9 C) (Oral)   Resp 18   Ht 5\' 7"  (1.702 m)   Wt 149 lb 11.2 oz (67.9 kg)   SpO2 99%   BMI 23.45 kg/m   Gen: well appearing, in NAD MSK: Full ROM, strength 5/5 to U/LE bilaterally, deep reflexes intact, normal gait.  No edema.  Neuro: Alert and oriented, speech normal.  Optic field normal. PERRL, Extraocular movements intact.  Intact symmetric sensation to light touch of face and extremities bilaterally.  Hearing grossly intact bilaterally.  Tongue protrudes normally with no deviation.  Shoulder shrug, smile symmetric. Finger to nose normal.   ASSESSMENT/PLAN:   Headache Sx most consistent with migraine given mostly unilateral with radiation, with nausea, and +FH. Possible she has rebound or medication overuse contributing. Normal neuro exam. Will give reglan for immediate abortive therapy. Sumatriptan provided for future headaches. Advised to return if headache persists, changing or worsening symptoms. Emergency precautions discussed.      , DO

## 2021-06-19 ENCOUNTER — Ambulatory Visit: Payer: Medicaid Other | Admitting: Family Medicine

## 2021-06-20 ENCOUNTER — Ambulatory Visit (INDEPENDENT_AMBULATORY_CARE_PROVIDER_SITE_OTHER): Payer: Medicaid Other

## 2021-06-20 ENCOUNTER — Telehealth: Payer: Self-pay

## 2021-06-20 DIAGNOSIS — D649 Anemia, unspecified: Secondary | ICD-10-CM

## 2021-06-20 LAB — POC HEMOCCULT BLD/STL (HOME/3-CARD/SCREEN)
Card #2 Fecal Occult Blod, POC: NEGATIVE
Card #3 Fecal Occult Blood, POC: NEGATIVE
Fecal Occult Blood, POC: NEGATIVE

## 2021-06-20 NOTE — Telephone Encounter (Signed)
Done

## 2021-06-20 NOTE — Telephone Encounter (Signed)
Stool card in lab 

## 2021-07-03 ENCOUNTER — Ambulatory Visit: Payer: Medicaid Other | Admitting: Nurse Practitioner

## 2021-07-03 ENCOUNTER — Encounter: Payer: Self-pay | Admitting: Nurse Practitioner

## 2021-07-03 ENCOUNTER — Other Ambulatory Visit: Payer: Self-pay

## 2021-07-03 VITALS — BP 118/80 | HR 96 | Temp 98.7°F | Resp 18 | Ht 67.0 in | Wt 156.1 lb

## 2021-07-03 DIAGNOSIS — R5383 Other fatigue: Secondary | ICD-10-CM | POA: Diagnosis not present

## 2021-07-03 DIAGNOSIS — Z23 Encounter for immunization: Secondary | ICD-10-CM

## 2021-07-03 DIAGNOSIS — D649 Anemia, unspecified: Secondary | ICD-10-CM

## 2021-07-03 DIAGNOSIS — R519 Headache, unspecified: Secondary | ICD-10-CM | POA: Diagnosis not present

## 2021-07-03 LAB — IRON,TIBC AND FERRITIN PANEL
%SAT: 7 % (calc) — ABNORMAL LOW (ref 16–45)
Ferritin: 4 ng/mL — ABNORMAL LOW (ref 16–154)
Iron: 32 ug/dL — ABNORMAL LOW (ref 40–190)
TIBC: 465 mcg/dL (calc) — ABNORMAL HIGH (ref 250–450)

## 2021-07-03 LAB — B12 AND FOLATE PANEL
Folate: 16.1 ng/mL
Vitamin B-12: 613 pg/mL (ref 200–1100)

## 2021-07-03 NOTE — Patient Instructions (Signed)
Eat iron rich foods or do iron supplements otc 3x a week or more often when having period

## 2021-07-03 NOTE — Progress Notes (Signed)
BP 118/80   Pulse 96   Temp 98.7 F (37.1 C) (Oral)   Resp 18   Ht 5\' 7"  (1.702 m)   Wt 156 lb 1.6 oz (70.8 kg)   SpO2 97%   BMI 24.45 kg/m    Subjective:    Patient ID: Melinda Snyder, female    DOB: [birth year], 20 y.o.   MRN: [id number]  HPI: Melinda Snyder is a 20 y.o. female  Chief Complaint  Patient presents with   Anemia    Follow up labs   Anemia: She says she has had anemia since middle school.  She said when she was younger she did take iron supplements.  She says her only symptoms currently is that she is cold and some fatigue.  Denies any shortness of breath, chest pain or dizziness.  She is not currently taking iron.  Discussed prior lab results.  Will get additional labs today.  Instructed patient to eat iron rich foods or do iron supplements otc 3x a week or more often when having period Hemoccult was negative 06/20/21. LMC: 2 weeks ago, normal flow, lasting 3-4 days on birth control   Migraines:  She reports she is feeling much better. She says her migraines start in the front and go down to her neck.  She has not taken any Imitrex.  She said the Reglan helped last time. Last migraine was 06/06/21.    Relevant past medical, surgical, family and social history reviewed and updated as indicated. Interim medical history since our last visit reviewed. Allergies and medications reviewed and updated.  Review of Systems  Constitutional: Negative for fever or weight change.  Respiratory: Negative for cough and shortness of breath.   Cardiovascular: Negative for chest pain or palpitations.  Gastrointestinal: Negative for abdominal pain, no bowel changes.  Musculoskeletal: Negative for gait problem or joint swelling.  Skin: Negative for rash.  Neurological: Negative for dizziness or headache.  No other specific complaints in a complete review of systems (except as listed in HPI above).      Objective:    BP 118/80   Pulse 96   Temp 98.7 F (37.1 C) (Oral)   Resp  18   Ht 5\' 7"  (1.702 m)   Wt 156 lb 1.6 oz (70.8 kg)   SpO2 97%   BMI 24.45 kg/m   Wt Readings from Last 3 Encounters:  07/03/21 156 lb 1.6 oz (70.8 kg)  06/06/21 149 lb 11.2 oz (67.9 kg)  05/23/21 150 lb 3.2 oz (68.1 kg)    Physical Exam  Constitutional: Patient appears well-developed and well-nourished. No distress.  HEENT: head atraumatic, normocephalic, pupils equal and reactive to light, neck supple Cardiovascular: Normal rate, regular rhythm and normal heart sounds.  No murmur heard. No BLE edema. Pulmonary/Chest: Effort normal and breath sounds normal. No respiratory distress. Abdominal: Soft.  There is no tenderness. Psychiatric: Patient has a normal mood and affect. behavior is normal. Judgment and thought content normal.   Results for orders placed or performed in visit on 06/20/21  POC Hemoccult Bld/Stl (3-Cd Home Screen)  Result Value Ref Range   Card #1 Date 05/29/2021    Fecal Occult Blood, POC Negative Negative   Card #2 Date 06/04/2021    Card #2 Fecal Occult Blod, POC Negative    Card #3 Date 06/11/2021    Card #3 Fecal Occult Blood, POC Negative       Assessment & Plan:   1. Anemia, unspecified type -Eat iron  rich foods or do iron supplements otc 3x a week or more often when having period - Iron, TIBC and Ferritin Panel - B12 and Folate Panel  2. Fatigue, unspecified type  - Iron, TIBC and Ferritin Panel - B12 and Folate Panel  3. migraine -has Imitrex if needed.  4. Need for influenza vaccination  - Flu Vaccine QUAD 6+ mos PF IM (Fluarix Quad PF)  5. Need for HPV vaccine  - HPV 9-valent vaccine,Recombinat   Follow up plan: Return in about 6 months (around 12/31/2021) for follow up.

## 2021-07-27 DIAGNOSIS — J4520 Mild intermittent asthma, uncomplicated: Secondary | ICD-10-CM | POA: Diagnosis not present

## 2021-07-27 DIAGNOSIS — J301 Allergic rhinitis due to pollen: Secondary | ICD-10-CM | POA: Diagnosis not present

## 2021-07-27 DIAGNOSIS — H1045 Other chronic allergic conjunctivitis: Secondary | ICD-10-CM | POA: Diagnosis not present

## 2021-07-27 DIAGNOSIS — J3081 Allergic rhinitis due to animal (cat) (dog) hair and dander: Secondary | ICD-10-CM | POA: Diagnosis not present

## 2021-08-07 DIAGNOSIS — R070 Pain in throat: Secondary | ICD-10-CM | POA: Diagnosis not present

## 2021-08-07 DIAGNOSIS — Z20822 Contact with and (suspected) exposure to covid-19: Secondary | ICD-10-CM | POA: Diagnosis not present

## 2021-08-07 DIAGNOSIS — J029 Acute pharyngitis, unspecified: Secondary | ICD-10-CM | POA: Diagnosis not present

## 2021-08-12 ENCOUNTER — Other Ambulatory Visit: Payer: Self-pay

## 2021-08-12 ENCOUNTER — Ambulatory Visit
Admission: EM | Admit: 2021-08-12 | Discharge: 2021-08-12 | Disposition: A | Discharge status: Home / Self Care | Payer: Medicaid Other | Attending: Internal Medicine | Admitting: Internal Medicine

## 2021-08-12 ENCOUNTER — Encounter: Payer: Self-pay | Admitting: Emergency Medicine

## 2021-08-12 DIAGNOSIS — M7912 Myalgia of auxiliary muscles, head and neck: Secondary | ICD-10-CM | POA: Diagnosis not present

## 2021-08-12 MED ORDER — CYCLOBENZAPRINE HCL 10 MG PO TABS: 10.0000 mg | ORAL_TABLET | Freq: Three times a day (TID) | ORAL | 0 refills | Status: DC

## 2021-08-12 MED ORDER — MELOXICAM 7.5 MG PO TABS: 7.5000 mg | ORAL_TABLET | Freq: Every day | ORAL | 0 refills | Status: DC

## 2021-08-12 NOTE — Discharge Instructions (Signed)
Follow up with your primary care doctor in 7-10 days

## 2021-08-12 NOTE — ED Provider Notes (Signed)
[facility]    CSN: [id number] Arrival date & time: 08/12/21  1450      History   Chief Complaint Chief Complaint  Patient presents with   Neck Pain    HPI Melinda Snyder is a 20 y.o. female who presents with R anterior neck swelling and pain. Has been to another urgent care and had neg strep and there was no treatment. Denies swollen glands, or ST. Denies straining her neck in any way    Past Medical History:  Diagnosis Date   Allergy    Asthma    Seasonal allergies     Patient Active Problem List   Diagnosis Date Noted   Headache 06/06/2021   Allergic rhinitis due to allergen 11/21/2018   Asthma without status asthmaticus 11/21/2018    Past Surgical History:  Procedure Laterality Date   NO PAST SURGERIES      OB History   No obstetric history on file.      Home Medications    Prior to Admission medications   Medication Sig Start Date End Date Taking? Authorizing Provider  albuterol (VENTOLIN HFA) 108 (90 Base) MCG/ACT inhaler INHALE 2 PUFFS EVERY 4 HOURS AS NEEDED 12/13/15  Yes [provider]  fluticasone-salmeterol (ADVAIR) 100-50 MCG/ACT AEPB Inhale 1 puff into the lungs 2 (two) times daily. 05/23/21  Yes Danelle Berry, PA-C  levocetirizine (XYZAL) 5 MG tablet Take 1 tablet (5 mg total) by mouth every evening. 05/23/21  Yes Danelle Berry, PA-C  montelukast (SINGULAIR) 10 MG tablet TAKE 1 TABLET BY MOUTH EVERYDAY AT BEDTIME 05/19/21  Yes Tapia, Leisa, PA-C  TRI-LO-MARZIA 0.18/0.215/0.25 MG-25 MCG tab TAKE 1 TABLET BY MOUTH EVERY DAY 04/18/21  Yes Sowles, Danna Hefty, MD  metoCLOPramide (REGLAN) 10 MG tablet Take 1 tablet (10 mg total) by mouth once for 1 dose. 06/06/21 06/06/21  Caro Laroche, DO  SUMAtriptan (IMITREX) 25 MG tablet Take 1 tablet (25 mg total) by mouth every 2 (two) hours as needed for migraine. May repeat in 2 hours if headache persists or recurs. 06/06/21   Caro Laroche, DO    Family History Family History  Problem  Relation Age of Onset   Hypertension Mother    Iron deficiency Mother    Heart disease Father    Kidney disease Father     Social History Social History   Tobacco Use   Smoking status: Never   Smokeless tobacco: Never  Vaping Use   Vaping Use: Never used  Substance Use Topics   Alcohol use: No   Drug use: No     Allergies   Grass extracts [gramineae pollens] and Tree extract   Review of Systems Review of Systems  Constitutional:  Negative for fatigue and fever.  HENT:  Negative for sore throat and trouble swallowing.   Musculoskeletal:  Positive for neck pain. Negative for neck stiffness.  Skin:  Negative for rash and wound.  Hematological:  Negative for adenopathy.    Physical Exam Triage Vital Signs ED Triage Vitals  Enc Vitals Group     BP 08/12/21 1536 112/75     Pulse Rate 08/12/21 1536 78     Resp 08/12/21 1536 14     Temp 08/12/21 1536 98.9 F (37.2 C)     Temp Source 08/12/21 1536 Oral     SpO2 08/12/21 1536 100 %     Weight 08/12/21 1534 156 lb (70.8 kg)     Height 08/12/21 1534 5\' 7"  (1.702 m)  Head Circumference --      Peak Flow --      Pain Score 08/12/21 1534 0     Pain Loc --      Pain Edu? --      Excl. in GC? --    No data found.  Updated Vital Signs BP 112/75 (BP Location: Left Arm)   Pulse 78   Temp 98.9 F (37.2 C) (Oral)   Resp 14   Ht 5\' 7"  (1.702 m)   Wt 156 lb (70.8 kg)   LMP 07/22/2021 (Approximate)   SpO2 100%   BMI 24.43 kg/m   Visual Acuity Right Eye Distance:   Left Eye Distance:   Bilateral Distance:    Right Eye Near:   Left Eye Near:    Bilateral Near:     Physical Exam Physical Exam Vitals signs and nursing note reviewed.  Constitutional:      General: She is not in acute distress.    Appearance: Normal appearance. She is not ill-appearing, toxic-appearing or diaphoretic.  HENT:     Head: Normocephalic.   Eyes:     General: No scleral icterus.       Right eye: No discharge.        Left eye:  No discharge.     Conjunctiva/sclera: Conjunctivae normal.  Neck:     Musculoskeletal: Neck supple. No neck rigidity. Has tenderness on distal sternocleidomastoid muscle which looks a little prominent when she looks up. Does not have lymphadenopathy or thyroidmegally    Pulmonary:     Effort: Pulmonary effort is normal.       Musculoskeletal: Normal range of motion.  Lymphadenopathy:     Cervical: No cervical adenopathy.  Skin:    General: Skin is warm and dry.     Coloration: Skin is not jaundiced.     Findings: No rash.  Neurological:     Mental Status: She is alert and oriented to person, place, and time.     Gait: Gait normal.  Psychiatric:        Mood and Affect: Mood normal.        Behavior: Behavior normal.        Thought Content: Thought content normal.        Judgment: Judgment normal.    UC Treatments / Results  Labs (all labs ordered are listed, but only abnormal results are displayed) Labs Reviewed - No data to display  EKG   Radiology No results found.  Procedures Procedures (including critical care time)  Medications Ordered in UC Medications - No data to display  Initial Impression / Assessment and Plan / UC Course  I have reviewed the triage vital signs and the nursing notes. R neck muscle strain I placed her on Flexeril and Mobic. See instructions  Final Clinical Impressions(s) / UC Diagnoses   Final diagnoses:  None   Discharge Instructions   None    ED Prescriptions   None    PDMP not reviewed this encounter.   09/21/2021, Garey Ham 08/12/21 1720

## 2021-08-12 NOTE — ED Triage Notes (Signed)
Patient states she has had swelling and pain in her right side of her neck for the past 2 week.  Patient states that she was seen at another UC and nothing was done.  Patient denies sore throat. Patient denies injury.  Patient denies fevers.

## 2021-08-28 ENCOUNTER — Ambulatory Visit (INDEPENDENT_AMBULATORY_CARE_PROVIDER_SITE_OTHER): Payer: Medicaid Other | Admitting: Nurse Practitioner

## 2021-08-28 ENCOUNTER — Other Ambulatory Visit: Payer: Self-pay

## 2021-08-28 ENCOUNTER — Encounter: Payer: Self-pay | Admitting: Nurse Practitioner

## 2021-08-28 VITALS — BP 118/76 | HR 98 | Temp 98.1°F | Resp 18 | Ht 67.0 in | Wt 165.1 lb

## 2021-08-28 DIAGNOSIS — M542 Cervicalgia: Secondary | ICD-10-CM

## 2021-08-28 DIAGNOSIS — M62838 Other muscle spasm: Secondary | ICD-10-CM

## 2021-08-28 MED ORDER — PREDNISONE 10 MG PO TABS: 10.0000 mg | ORAL_TABLET | Freq: Every day | ORAL | 0 refills | Status: AC

## 2021-08-28 NOTE — Progress Notes (Addendum)
BP 118/76   Pulse 98   Temp 98.1 F (36.7 C) (Oral)   Resp 18   Ht 5\' 7"  (1.702 m)   Wt 165 lb 1.6 oz (74.9 kg)   SpO2 99%   BMI 25.86 kg/m    Subjective:    Patient ID: Melinda Snyder, female    DOB: 2001/07/06, 20 y.o.   MRN: [birth year]/13/[id number]  HPI: Melinda Snyder is a 20 y.o. female, here alone  Chief Complaint  Patient presents with   Neck Pain    Follow up seen at UC   Right side neck pain:  She says she noticed it around 07/29/21.  No known injury.  She works for 09/28/21 and moves a [employer].  She was seen at urgent care on 08/12/21.  Was prescribed mobic and flexeril.  She says that has helped but pain fluctuates.  She denies any radiation or numbness or tingling.  It will get better and then hurt again.  She has had a similar issue about 4 months ago but it radiated to her right shoulder.  She says it feels similar.  Encouraged her to reduce repetitive movements.  Which is difficult in her career.  Heat therapy, stretching, lidocaine patches, continue mobic and flexeril as needed.    Relevant past medical, surgical, family and social history reviewed and updated as indicated. Interim medical history since our last visit reviewed. Allergies and medications reviewed and updated.  Review of Systems  Constitutional: Negative for fever or weight change.  Respiratory: Negative for cough and shortness of breath.   Cardiovascular: Negative for chest pain or palpitations.  Gastrointestinal: Negative for abdominal pain, no bowel changes.  Musculoskeletal: Negative for gait problem or joint swelling. Positive right side neck pain Skin: Negative for rash.  Neurological: Negative for dizziness or headache.  No other specific complaints in a complete review of systems (except as listed in HPI above).      Objective:    BP 118/76   Pulse 98   Temp 98.1 F (36.7 C) (Oral)   Resp 18   Ht 5\' 7"  (1.702 m)   Wt 165 lb 1.6 oz (74.9 kg)   SpO2 99%   BMI 25.86 kg/m   Wt Readings  from Last 3 Encounters:  08/28/21 165 lb 1.6 oz (74.9 kg)  08/12/21 156 lb (70.8 kg)  07/03/21 156 lb 1.6 oz (70.8 kg)    Physical Exam  Constitutional: Patient appears well-developed and well-nourished. No distress.  HEENT: head atraumatic, normocephalic, pupils equal and reactive to light,  neck supple Cardiovascular: Normal rate, regular rhythm and normal heart sounds.  No murmur heard. No BLE edema. Pulmonary/Chest: Effort normal and breath sounds normal. No respiratory distress. Abdominal: Soft.  There is no tenderness. Musculoskeletal: right side neck tenderness and tightness, no decrease in ROM Psychiatric: Patient has a normal mood and affect. behavior is normal. Judgment and thought content normal.   Results for orders placed or performed in visit on 07/03/21  Iron, TIBC and Ferritin Panel  Result Value Ref Range   Iron 32 (L) 40 - 190 mcg/dL   TIBC 09/02/21 (H) 09/02/21 - 741 mcg/dL (calc)   %SAT 7 (L) 16 - 45 % (calc)   Ferritin 4 (L) 16 - 154 ng/mL  B12 and Folate Panel  Result Value Ref Range   Vitamin B-12 613 200 - 1,100 pg/mL   Folate 16.1 ng/mL      Assessment & Plan:   1. Neck pain  on right side -continue flexeril from urgent care -heat therapy -rest and decrease repetitive motion - predniSONE (DELTASONE) 10 MG tablet; Take 1 tablet (10 mg total) by mouth daily with breakfast for 5 days.  Dispense: 5 tablet; Refill: 0  2. Neck muscle spasm -continue flexeril from urgent care -heat therapy -rest and decrease repetitive motion - predniSONE (DELTASONE) 10 MG tablet; Take 1 tablet (10 mg total) by mouth daily with breakfast for 5 days.  Dispense: 5 tablet; Refill: 0   Follow up plan: Return if symptoms worsen or fail to improve.

## 2021-09-20 ENCOUNTER — Ambulatory Visit (INDEPENDENT_AMBULATORY_CARE_PROVIDER_SITE_OTHER): Payer: Medicaid Other | Admitting: Family Medicine

## 2021-09-20 ENCOUNTER — Encounter: Payer: Self-pay | Admitting: Family Medicine

## 2021-09-20 VITALS — BP 122/84 | HR 102 | Temp 98.3°F | Resp 16 | Ht 67.0 in | Wt 163.6 lb

## 2021-09-20 DIAGNOSIS — F419 Anxiety disorder, unspecified: Secondary | ICD-10-CM | POA: Insufficient documentation

## 2021-09-20 MED ORDER — SERTRALINE HCL 25 MG PO TABS: 25.0000 mg | ORAL_TABLET | Freq: Every day | ORAL | 3 refills | Status: DC

## 2021-09-20 MED ORDER — HYDROXYZINE PAMOATE 25 MG PO CAPS: 25.0000 mg | ORAL_CAPSULE | Freq: Three times a day (TID) | ORAL | 0 refills | Status: DC | PRN

## 2021-09-20 NOTE — Assessment & Plan Note (Signed)
Start sertraline. Hydroxyzine for attacks prn. Referred for counseling. F/u in 4 weeks.

## 2021-09-20 NOTE — Patient Instructions (Signed)
It was great to see you!  Our plans for today:  - Take the sertraline every day as prescribed. It can take a few weeks to feel effect. - Take the hydroxyzine as needed for anxiety attacks. This can make your sleepy.   Take care and seek immediate care sooner if you develop any concerns.   Dr. Linwood Dibbles

## 2021-09-20 NOTE — Progress Notes (Signed)
   SUBJECTIVE:   CHIEF COMPLAINT / HPI:   Anxiety - had attack Sunday while driving in traffic. Again at work Monday, had to leave job.  - has had previous attacks in the past, not this bad.  - Medications: none - Taking: n/a - Counseling: none currently - Previous hospitalizations: none - FH of psych illness: sister with anxiety - Symptoms: worrying, nervous  GAD 7 : Generalized Anxiety Score 09/20/2021 09/20/2021 08/28/2021 07/03/2021  Nervous, Anxious, on Edge 2 2 0 1  Control/stop worrying 3 2 0 2  Worry too much - different things 3 3 0 2  Trouble relaxing 1 2 0 1  Restless 1 1 0 0  Easily annoyed or irritable 1 2 0 2  Afraid - awful might happen 2 2 0 0  Total GAD 7 Score 13 14 0 8  Anxiety Difficulty - Very difficult Not difficult at all Somewhat difficult    Depression screen [facility] 2/9 09/20/2021 09/20/2021 08/28/2021  Decreased Interest 1 1 1   Down, Depressed, Hopeless 0 0 1  PHQ - 2 Score 1 1 2   Altered sleeping 0 0 0  Tired, decreased energy 1 1 0  Change in appetite 1 1 0  Feeling bad or failure about yourself  1 0 0  Trouble concentrating 1 1 0  Moving slowly or fidgety/restless 1 1 0  Suicidal thoughts 0 0 0  PHQ-9 Score 6 5 2   Difficult doing work/chores - Somewhat difficult Not difficult at all  Some recent data might be hidden      OBJECTIVE:   BP 122/84   Pulse (!) 102   Temp 98.3 F (36.8 C)   Resp 16   Ht 5\' 7"  (1.702 m)   Wt 163 lb 9.6 oz (74.2 kg)   SpO2 94%   BMI 25.62 kg/m   Gen: well appearing, in NAD Psych: pleasant, nervous. Mood and affect congruent.  ASSESSMENT/PLAN:   Anxiety Start sertraline. Hydroxyzine for attacks prn. Referred for counseling. F/u in 4 weeks.     , DO

## 2021-09-21 ENCOUNTER — Telehealth: Payer: Self-pay

## 2021-09-21 NOTE — Telephone Encounter (Signed)
Copied from CRM [id number]. Topic: General - Other >> Sep 21, 2021 11:18 AM McGill, Darlina Rumpf wrote: Reason for CRM: pt stated she needs her letter for work updated. She stated she needs it to include her Name,dob,date of appointment, dates she took off work 09/19/2021-09/28/21 and any restrictions for going back to work.

## 2021-09-21 NOTE — Telephone Encounter (Signed)
Dr.Rumball pt is adding more days to return to work will you be ok with that and we can get fixed for you? Just want to know if you approve it.

## 2021-09-22 NOTE — Telephone Encounter (Signed)
Work Letter was updated will notify pt by [employer].

## 2021-10-02 ENCOUNTER — Encounter: Payer: Self-pay | Admitting: Family Medicine

## 2021-10-03 MED ORDER — SERTRALINE HCL 50 MG PO TABS: 25.0000 mg | ORAL_TABLET | Freq: Every day | ORAL | 0 refills | Status: DC

## 2021-10-06 ENCOUNTER — Ambulatory Visit: Payer: Self-pay | Admitting: *Deleted

## 2021-10-06 NOTE — Telephone Encounter (Signed)
Reason for Disposition  Symptoms interfere with work or school  Answer Assessment - Initial Assessment Questions 1. CONCERN: "Did anything happen that prompted you to call today?"      Medication not helping- patient is still unable to go to work- increased medication to 50 mg this week- patient does have more energy 2. ANXIETY SYMPTOMS: "Can you describe how you (your loved one; patient) have been feeling?" (e.g., tense, restless, panicky, anxious, keyed up, overwhelmed, sense of impending doom).      Patient has energy- but unable to go to work, patient does not leave home by herself- patient is driving- but with another person in the car 3. ONSET: "How long have you been feeling this way?" (e.g., hours, days, weeks)     chronic 4. SEVERITY: "How would you rate the level of anxiety?" (e.g., 0 - 10; or mild, moderate, severe).     Moderate to severe 5. FUNCTIONAL IMPAIRMENT: "How have these feelings affected your ability to do daily activities?" "Have you had more difficulty than usual doing your normal daily activities?" (e.g., getting better, same, worse; self-care, school, work, interactions)     Unable to go to work or leave home by herself 6. HISTORY: "Have you felt this way before?" "Have you ever been diagnosed with an anxiety problem in the past?" (e.g., generalized anxiety disorder, panic attacks, PTSD). If Yes, ask: "How was this problem treated?" (e.g., medicines, counseling, etc.)     Chronic-   3 weeks ago had first attack 7. RISK OF HARM - SUICIDAL IDEATION: "Do you ever have thoughts of hurting or killing yourself?" If Yes, ask:  "Do you have these feelings now?" "Do you have a plan on how you would do this?"     no 8. TREATMENT:  "What has been done so far to treat this anxiety?" (e.g., medicines, relaxation strategies). "What has helped?"    Patient is using maintenance medication  9. TREATMENT - THERAPIST: "Do you have a counselor or therapist? Name?"     Needs to  schedule 10. POTENTIAL TRIGGERS: "Do you drink caffeinated beverages (e.g., coffee, colas, teas), and how much daily?" "Do you drink alcohol or use any drugs?" "Have you started any new medicines recently?"     No- no new medications 10. PATIENT SUPPORT: "Who is with you now?" "Who do you live with?" "Do you have family or friends who you can talk to?"        Family and friend support- sisters are with patient 89. OTHER SYMPTOMS: "Do you have any other symptoms?" (e.g., feeling depressed, trouble concentrating, trouble sleeping, trouble breathing, palpitations or fast heartbeat, chest pain, sweating, nausea, or diarrhea)       Some- anxious, scared, uneasy 12. PREGNANCY: "Is there any chance you are pregnant?" "When was your last menstrual period?"       No- LMP-regular cycles  Protocols used: Anxiety and Panic Attack-A-AH

## 2021-10-06 NOTE — Telephone Encounter (Signed)
°  Chief Complaint: anxiety Symptoms: increase in dosing of medication is not helping- patient unable to go to work today Frequency: daily- patient unable to go to work- appointment scheduled at first available-12/20- patient needs note for work- today and tomorrow Pertinent Negatives: Patient denies suicidal thoughts Disposition: [] ED /[] Urgent Care (no appt availability in office) / [x] Appointment(In office/virtual)/ []  [facility]/ [] Home Care/ [] Refused Recommended Disposition  Additional Notes: Patient states medication increase is not helping- she was unable to leave home for work- next appointment moved up- 12/20- but patient needs note for work- today and tomorrow

## 2021-10-10 ENCOUNTER — Telehealth (INDEPENDENT_AMBULATORY_CARE_PROVIDER_SITE_OTHER): Payer: Medicaid Other | Admitting: Nurse Practitioner

## 2021-10-10 ENCOUNTER — Other Ambulatory Visit: Payer: Self-pay

## 2021-10-10 ENCOUNTER — Encounter: Payer: Self-pay | Admitting: Nurse Practitioner

## 2021-10-10 DIAGNOSIS — F419 Anxiety disorder, unspecified: Secondary | ICD-10-CM | POA: Diagnosis not present

## 2021-10-10 MED ORDER — BUSPIRONE HCL 5 MG PO TABS: 5.0000 mg | ORAL_TABLET | Freq: Two times a day (BID) | ORAL | 0 refills | Status: DC

## 2021-10-10 NOTE — Progress Notes (Signed)
Name: Melinda Snyder   MRN: [id number]    DOB: 2001/09/19   Date:10/10/2021       Progress Note  Subjective  Chief Complaint  Chief Complaint  Patient presents with   Anxiety    Medication was increased, sleep pattern changes, hard to prepare for work , not being comfortable being alone    I connected with  CLARINDA OBI  on 10/10/21 at 14:45pm by a video enabled telemedicine application and verified that I am speaking with the correct person using two identifiers.  I discussed the limitations of evaluation and management by telemedicine and the availability of in person appointments. The patient expressed understanding and agreed to proceed with a virtual visit  Staff also discussed with the patient that there may be a patient responsible charge related to this service. Patient Location: home Provider Location: [facility] Additional Individuals present: alone  HPI  Anxiety: She started taking Zoloft 25 mg daily on 09/20/21, she was also prescribed hydroxyzine 25 mg PRN for attacks. She says that a week ago she increased her Zoloft to 50 mg daily.  She says she has taken the hydroxyzine two times but she says she forgets to take it when she is having an anxiety attack.  She says she has had several anxiety attacks which have prevented her from going to work.  She says she just gets a bad feeling and then can not talk her self out of the anxiety.  She says she usually calls her sister or her mom to help her.  Discussed that the zoloft will take time to work.  She says that she was able to change her insurance so that counseling is covered.  She says the changes should go in to effect in two weeks.  She plans to start counseling at that time.  Discussed trying Buspar to help with the anxiety.  She is agreeable to try that.  Will have her follow up in three weeks since she just increased her zoloft.   GAD 7 : Generalized Anxiety Score 10/10/2021 09/20/2021 09/20/2021 08/28/2021  Nervous, Anxious, on  Edge 2 2 2  0  Control/stop worrying 1 3 2  0  Worry too much - different things 1 3 3  0  Trouble relaxing 3 1 2  0  Restless 0 1 1 0  Easily annoyed or irritable 2 1 2  0  Afraid - awful might happen 1 2 2  0  Total GAD 7 Score 10 13 14  0  Anxiety Difficulty Somewhat difficult - Very difficult Not difficult at all     Depression screen [facility] 2/9 10/10/2021 09/20/2021 09/20/2021 08/28/2021 07/03/2021  Decreased Interest 2 1 1 1 1   Down, Depressed, Hopeless 1 0 0 1 1  PHQ - 2 Score 3 1 1 2 2   Altered sleeping 3 0 0 0 2  Tired, decreased energy 1 1 1  0 2  Change in appetite 3 1 1  0 2  Feeling bad or failure about yourself  1 1 0 0 2  Trouble concentrating 3 1 1  0 1  Moving slowly or fidgety/restless 3 1 1  0 0  Suicidal thoughts 0 0 0 0 0  PHQ-9 Score 17 6 5 2 11   Difficult doing work/chores Somewhat difficult - Somewhat difficult Not difficult at all Very difficult  Some recent data might be hidden     Patient Active Problem List   Diagnosis Date Noted   Anxiety 09/20/2021   Headache 06/06/2021   Allergic rhinitis due to  allergen 11/21/2018   Asthma without status asthmaticus 11/21/2018    Social History   Tobacco Use   Smoking status: Never   Smokeless tobacco: Never  Substance Use Topics   Alcohol use: No     Current Outpatient Medications:    albuterol (VENTOLIN HFA) 108 (90 Base) MCG/ACT inhaler, INHALE 2 PUFFS EVERY 4 HOURS AS NEEDED, Disp: , Rfl:    fluticasone (FLONASE) 50 MCG/ACT nasal spray, Place into both nostrils daily., Disp: , Rfl:    fluticasone-salmeterol (ADVAIR) 100-50 MCG/ACT AEPB, Inhale 1 puff into the lungs 2 (two) times daily., Disp: 60 each, Rfl: 11   hydrOXYzine (VISTARIL) 25 MG capsule, Take 1 capsule (25 mg total) by mouth every 8 (eight) hours as needed for anxiety., Disp: 30 capsule, Rfl: 0   levocetirizine (XYZAL) 5 MG tablet, Take 1 tablet (5 mg total) by mouth every evening., Disp: 90 tablet, Rfl: 1   meloxicam (MOBIC) 7.5 MG tablet, Take 1  tablet (7.5 mg total) by mouth daily. For pain and inflammation, Disp: 14 tablet, Rfl: 0   montelukast (SINGULAIR) 10 MG tablet, TAKE 1 TABLET BY MOUTH EVERYDAY AT BEDTIME, Disp: 90 tablet, Rfl: 1   sertraline (ZOLOFT) 50 MG tablet, Take 0.5 tablets (25 mg total) by mouth daily., Disp: 30 tablet, Rfl: 0   TRI-LO-MARZIA 0.18/0.215/0.25 MG-25 MCG tab, TAKE 1 TABLET BY MOUTH EVERY DAY, Disp: 84 tablet, Rfl: 1   cyclobenzaprine (FLEXERIL) 10 MG tablet, Take 1 tablet (10 mg total) by mouth 3 (three) times daily. (Patient not taking: Reported on 09/20/2021), Disp: 21 tablet, Rfl: 0  Allergies  Allergen Reactions   Grass Extracts [Gramineae Pollens]    Tree Extract     I personally reviewed active problem list, medication list, allergies, notes from last encounter with the patient/caregiver today.  ROS  Constitutional: Negative for fever or weight change.  Respiratory: Negative for cough and shortness of breath.   Cardiovascular: Negative for chest pain or palpitations.  Gastrointestinal: Negative for abdominal pain, no bowel changes.  Musculoskeletal: Negative for gait problem or joint swelling.  Skin: Negative for rash.  Neurological: Negative for dizziness or headache.  No other specific complaints in a complete review of systems (except as listed in HPI above).   Objective  Virtual encounter, vitals not obtained.  There is no height or weight on file to calculate BMI.  Nursing Note and Vital Signs reviewed.  Physical Exam  Awake alert and oriented, speaking in complete sentences  No results found for this or any previous visit (from the past 72 hour(s)).  Assessment & Plan  1. Anxiety -continue taking the zoloft 50 mg daily - busPIRone (BUSPAR) 5 MG tablet; Take 1 tablet (5 mg total) by mouth 2 (two) times daily.  Dispense: 60 tablet; Refill: 0   -Red flags and when to present for emergency care or RTC including fever >101.38F, chest pain, shortness of breath,  new/worsening/un-resolving symptoms, reviewed with patient at time of visit. Follow up and care instructions discussed and provided in AVS. - I discussed the assessment and treatment plan with the patient. The patient was provided an opportunity to ask questions and all were answered. The patient agreed with the plan and demonstrated an understanding of the instructions.  I provided 15 minutes of non-face-to-face time during this encounter.  Berniece Salines, FNP

## 2021-10-10 NOTE — Telephone Encounter (Signed)
Appointment today

## 2021-10-11 ENCOUNTER — Ambulatory Visit: Payer: Self-pay | Admitting: *Deleted

## 2021-10-11 NOTE — Telephone Encounter (Signed)
Reason for Disposition  [1] Caller has URGENT medicine question about med that PCP or specialist prescribed AND [2] triager unable to answer question    Needs clarification on Zoloft dose  Answer Assessment - Initial Assessment Questions 1. NAME of MEDICATION: "What medicine are you calling about?"     Pt calling in.   Zoloft 2. QUESTION: "What is your question?" (e.g., double dose of medicine, side effect)     I  got increased to 50 mg but rx said takes 1/2 tablet daily.  Do I take 1/2 or a whole pill? 3. PRESCRIBING HCP: "Who prescribed it?" Reason: if prescribed by specialist, call should be referred to that group.     Al[place] 4. SYMPTOMS: "Do you have any symptoms?"     N/A 5. SEVERITY: If symptoms are present, ask "Are they mild, moderate or severe?"     N/A 6. PREGNANCY:  "Is there any chance that you are pregnant?" "When was your last menstrual period?"     N/A  Protocols used: Medication Question Call-A-AH

## 2021-10-11 NOTE — Telephone Encounter (Signed)
°  Chief Complaint: Needs clarification on Zoloft dose.  Her dose was increased to 50 mg however on the bottle of 50 mg tablets it's instructing her to take 1/2 tablet (25 mg).  Which should she be taking a whole or half a tablet? Symptoms: N/A Frequency: N/A Pertinent Negatives: Patient denies N/A Disposition: [] ED /[] Urgent Care (no appt availability in office) / [] Appointment(In office/virtual)/ []  [place] [facility]/ [] Home Care/ [] Refused Recommended Disposition  Additional Notes: I have sent a message to Dr. for clarification on the dosage of Zoloft.   I let pt know someone would be calling her back.   Pt was agreeable to this plan.   It's ok to leave a detailed message on her phone because she can't answer until 12:00 or after today.  My notes sent to [facility]

## 2021-10-15 ENCOUNTER — Other Ambulatory Visit: Payer: Self-pay | Admitting: Family Medicine

## 2021-10-15 DIAGNOSIS — Z3041 Encounter for surveillance of contraceptive pills: Secondary | ICD-10-CM

## 2021-10-20 ENCOUNTER — Ambulatory Visit: Payer: Medicaid Other | Admitting: Nurse Practitioner

## 2021-10-29 ENCOUNTER — Other Ambulatory Visit: Payer: Self-pay | Admitting: Family Medicine

## 2021-10-30 NOTE — Telephone Encounter (Signed)
Requested Prescriptions  Pending Prescriptions Disp Refills   montelukast (SINGULAIR) 10 MG tablet [Pharmacy Med Name: MONTELUKAST SOD 10 MG TABLET] 90 tablet 1    Sig: TAKE 1 TABLET BY MOUTH EVERYDAY AT BEDTIME     Pulmonology:  Leukotriene Inhibitors Passed - 10/29/2021  7:37 PM      Passed - Valid encounter within last 12 months    Recent Outpatient Visits          2 weeks ago Anxiety   [facility] Berniece Salines, FNP   1 month ago Anxiety   [facility] Caro Laroche, DO   2 months ago Neck pain on right side   [facility] Berniece Salines, FNP   3 months ago Anemia, unspecified type   [facility] Della Goo F, FNP   4 months ago Acute intractable headache, unspecified headache type   [facility] Caro Laroche, DO      Future Appointments            In 2 months Danelle Berry, PA-C [facility], [facility]

## 2021-11-07 ENCOUNTER — Other Ambulatory Visit: Payer: Self-pay | Admitting: Nurse Practitioner

## 2021-11-07 DIAGNOSIS — F419 Anxiety disorder, unspecified: Secondary | ICD-10-CM

## 2021-11-07 NOTE — Telephone Encounter (Signed)
Requested Prescriptions  Pending Prescriptions Disp Refills   busPIRone (BUSPAR) 5 MG tablet [Pharmacy Med Name: BUSPIRONE HCL 5 MG TABLET] 60 tablet 2    Sig: TAKE 1 TABLET BY MOUTH TWICE A DAY     Psychiatry: Anxiolytics/Hypnotics - Non-controlled Passed - 11/07/2021  9:15 PM      Passed - Valid encounter within last 6 months    Recent Outpatient Visits          4 weeks ago Anxiety   [facility] Berniece Salines, FNP   1 month ago Anxiety   [facility] Dense M, DO   2 months ago Neck pain on right side   [facility] Berniece Salines, FNP   4 months ago Anemia, unspecified type   [facility] Della Goo F, FNP   5 months ago Acute intractable headache, unspecified headache type   [facility] Caro Laroche, DO      Future Appointments            In 1 month Danelle Berry, PA-C [facility], [facility]

## 2021-11-10 ENCOUNTER — Other Ambulatory Visit: Payer: Self-pay | Admitting: Family Medicine

## 2021-11-10 MED ORDER — SERTRALINE HCL 50 MG PO TABS: 25.0000 mg | ORAL_TABLET | Freq: Every day | ORAL | 0 refills | Status: DC

## 2021-11-10 NOTE — Telephone Encounter (Signed)
Copied from CRM [id number]. Topic: Quick Communication - Rx Refill/Question >> Nov 10, 2021  9:55 AM Marylen Ponto wrote: Medication: sertraline (ZOLOFT) 50 MG tablet  Has the patient contacted their pharmacy? Yes.  Pt told to contact provider (Agent: If no, request that the patient contact the pharmacy for the refill. If patient does not wish to contact the pharmacy document the reason why and proceed with request.) (Agent: If yes, when and what did the pharmacy advise?)  Preferred Pharmacy (with phone number or street name): [id number] - [place], [place] - [address]  Phone: [phone] Fax: [phone]  Has the patient been seen for an appointment in the last year OR does the patient have an upcoming appointment? Yes.  e  Agent: Please be advised that RX refills may take up to 3 business days. We ask that you follow-up with your pharmacy.

## 2021-11-10 NOTE — Telephone Encounter (Signed)
Requested Prescriptions  Pending Prescriptions Disp Refills   sertraline (ZOLOFT) 50 MG tablet 30 tablet 0    Sig: Take 0.5 tablets (25 mg total) by mouth daily.     Psychiatry:  Antidepressants - SSRI Passed - 11/10/2021 12:20 PM      Passed - Valid encounter within last 6 months    Recent Outpatient Visits          1 month ago Anxiety   [facility] Berniece Salines, FNP   1 month ago Anxiety   [facility] Caro Laroche, DO   2 months ago Neck pain on right side   [facility] Berniece Salines, FNP   4 months ago Anemia, unspecified type   [facility] Della Goo F, FNP   5 months ago Acute intractable headache, unspecified headache type   [facility] Caro Laroche, DO      Future Appointments            In 1 week Margarita Mail, DO [facility], [facility]   In 1 month Danelle Berry, PA-C [facility], [facility]

## 2021-11-20 ENCOUNTER — Ambulatory Visit: Payer: Medicaid Other | Admitting: Internal Medicine

## 2021-11-20 ENCOUNTER — Encounter: Payer: Self-pay | Admitting: Internal Medicine

## 2021-11-20 VITALS — BP 108/72 | HR 99 | Temp 98.3°F | Resp 16 | Ht 67.0 in | Wt 157.2 lb

## 2021-11-20 DIAGNOSIS — F419 Anxiety disorder, unspecified: Secondary | ICD-10-CM | POA: Diagnosis not present

## 2021-11-20 DIAGNOSIS — B379 Candidiasis, unspecified: Secondary | ICD-10-CM

## 2021-11-20 MED ORDER — NYSTATIN 100000 UNIT/GM EX POWD: 1.0000 "application " | Freq: Three times a day (TID) | CUTANEOUS | 0 refills | Status: DC

## 2021-11-20 MED ORDER — NYSTATIN 100000 UNIT/GM EX CREA: 1.0000 "application " | TOPICAL_CREAM | Freq: Two times a day (BID) | CUTANEOUS | 0 refills | Status: DC

## 2021-11-20 NOTE — Patient Instructions (Addendum)
It was great seeing you today!  Plan discussed at today's visit: --Make sure to keep area clean and dry, use medicated powders after the shower -Yeast cream sent to pharmacy as well  -Third HPV vaccine due   Follow up in: as needed  Take care and let us know if you have any questions or concerns prior to your next visit.  Dr. Caralee Ates  Skin Yeast Infection Outline of a female's lower body with a close-up view of a skin yeast infection.   A skin yeast infection is a condition in which there is an overgrowth of yeast (Candida) that normally lives on the skin. This condition usually occurs in areas of the skin that are constantly warm and moist, such as the skin under the breasts or armpits, or in the groin and other body folds. What are the causes? This condition is caused by a change in the normal balance of the yeast that live on the skin. What increases the risk? You are more likely to develop this condition if you: Are obese. Are pregnant. Are 16 years of age or older. Wear tight clothing. Have any of the following conditions: Diabetes. Malnutrition. A weak body defense system (immune system). Take medicines such as: Birth control pills. Antibiotics. Steroid medicines. What are the signs or symptoms? The most common symptom of this condition is itchiness in the affected area. Other symptoms include: A red, swollen area of the skin. Bumps on the skin. How is this diagnosed? This condition is diagnosed with a medical history and physical exam. Your health care provider may check for yeast by taking scrapings of the skin to be viewed under a microscope. How is this treated? This condition is treated with medicine. Medicines may be prescribed or available over the counter. The medicines may be: Taken by mouth (orally). Applied as a cream or powder to your skin. Follow these instructions at home: A tube of ointment.  Take or apply over-the-counter and prescription medicines  only as told by your health care provider. Maintain a healthy weight. If you need help losing weight, talk with your health care provider. Keep your skin clean and dry. Wear loose-fitting clothing. If you have diabetes, keep your blood sugar under control. Keep all follow-up visits. This is important. Contact a health care provider if: Your symptoms go away and then come back. Your symptoms do not get better with treatment. Your symptoms get worse. Your rash spreads. You have a fever or chills. You have new symptoms. You have new warmth or redness of your skin. Your rash is painful or bleeding. Summary A skin yeast infection is a condition in which there is an overgrowth of yeast (Candida) that normally lives on the skin. Take or apply over-the-counter and prescription medicines only as told by your health care provider. Keep your skin clean and dry. Contact a health care provider if your symptoms do not get better with treatment. This information is not intended to replace advice given to you by your health care provider. Make sure you discuss any questions you have with your health care provider. Document Revised: 12/27/2020 Document Reviewed: 12/27/2020 Elsevier Patient Education  2022 [employer].

## 2021-11-20 NOTE — Progress Notes (Signed)
Acute Office Visit  Subjective:    Patient ID: Melinda Snyder, female    DOB: [birth year], 21 y.o.   MRN: [id number]  Chief Complaint  Patient presents with   Skin Discoloration    On buttocks, and middle of chest    HPI Patient is in today for skin discoloration.  RASH Duration:  chronic; 4 years on chest, spot on buttocks for 1 year Location:  in between breasts and on buttock   Itching: no Burning: no Redness: no Oozing: no Blisters: no Painful: no Fevers: no Change in detergents/soaps/personal care products: no Recent illness: no Recent travel:no History of same: yes Context: stable Alleviating factors: nothing Treatments attempted:lotion/moisturizer   Past Medical History:  Diagnosis Date   Allergy    Asthma    Seasonal allergies     Past Surgical History:  Procedure Laterality Date   NO PAST SURGERIES      Family History  Problem Relation Age of Onset   Hypertension Mother    Iron deficiency Mother    Heart disease Father    Kidney disease Father     Social History   Socioeconomic History   Marital status: Single    Spouse name: Not on file   Number of children: Not on file   Years of education: 12   Highest education level: Not on file  Occupational History   Occupation: part time     Employer: [employer]  Tobacco Use   Smoking status: Never   Smokeless tobacco: Never  Vaping Use   Vaping Use: Never used  Substance and Sexual Activity   Alcohol use: No   Drug use: No   Sexual activity: Not Currently    Partners: Male    Birth control/protection: Pill  Other Topics Concern   Not on file  Social History Narrative   Not on file   Social Determinants of Health   Financial Resource Strain: Low Risk    Difficulty of Paying Living Expenses: Not hard at all  Food Insecurity: No Food Insecurity   Worried About Charity fundraiser in the Last Year: Never true   Ran Out of Food in the Last Year: Never true  Transportation Needs: No  Transportation Needs   Lack of Transportation (Medical): No   Lack of Transportation (Non-Medical): No  Physical Activity: Sufficiently Active   Days of Exercise per Week: 6 days   Minutes of Exercise per Session: 80 min  Stress: No Stress Concern Present   Feeling of Stress : Not at all  Social Connections: Moderately Isolated   Frequency of Communication with Friends and Family: More than three times a week   Frequency of Social Gatherings with Friends and Family: Twice a week   Attends Religious Services: 1 to 4 times per year   Active Member of [employer] or Organizations: No   Attends Archivist Meetings: Never   Marital Status: Never married  Human resources officer Violence: Not At Risk   Fear of Current or Ex-Partner: No   Emotionally Abused: No   Physically Abused: No   Sexually Abused: No    Outpatient Medications Prior to Visit  Medication Sig Dispense Refill   albuterol (VENTOLIN HFA) 108 (90 Base) MCG/ACT inhaler INHALE 2 PUFFS EVERY 4 HOURS AS NEEDED     busPIRone (BUSPAR) 5 MG tablet TAKE 1 TABLET BY MOUTH TWICE A DAY 60 tablet 2   fluticasone (FLONASE) 50 MCG/ACT nasal spray Place into both nostrils daily.  fluticasone-salmeterol (ADVAIR) 100-50 MCG/ACT AEPB Inhale 1 puff into the lungs 2 (two) times daily. 60 each 11   levocetirizine (XYZAL) 5 MG tablet Take 1 tablet (5 mg total) by mouth every evening. 90 tablet 1   meloxicam (MOBIC) 7.5 MG tablet Take 1 tablet (7.5 mg total) by mouth daily. For pain and inflammation 14 tablet 0   montelukast (SINGULAIR) 10 MG tablet TAKE 1 TABLET BY MOUTH EVERYDAY AT BEDTIME 90 tablet 0   sertraline (ZOLOFT) 50 MG tablet Take 0.5 tablets (25 mg total) by mouth daily. 90 tablet 0   TRI-LO-MARZIA 0.18/0.215/0.25 MG-25 MCG tab TAKE 1 TABLET BY MOUTH EVERY DAY 84 tablet 1   No facility-administered medications prior to visit.    Allergies  Allergen Reactions   Grass Extracts [Gramineae Pollens]    Tree Extract     Review of  Systems  Constitutional:  Negative for chills and fever.  Skin:  Positive for color change.      Objective:    Physical Exam Constitutional:      Appearance: Normal appearance.  HENT:     Head: Normocephalic and atraumatic.  Eyes:     Conjunctiva/sclera: Conjunctivae normal.  Cardiovascular:     Rate and Rhythm: Normal rate and regular rhythm.  Pulmonary:     Effort: Pulmonary effort is normal.     Breath sounds: Normal breath sounds.  Musculoskeletal:     Right lower leg: No edema.     Left lower leg: No edema.  Skin:    General: Skin is warm and dry.     Comments: Macular hypopigmented rash in between and under breasts, could not appreciate any skin changes on buttocks  Neurological:     General: No focal deficit present.     Mental Status: She is alert. Mental status is at baseline.  Psychiatric:        Mood and Affect: Mood normal.        Behavior: Behavior normal.    BP 108/72    Pulse 99    Temp 98.3 F (36.8 C)    Resp 16    Ht 5' 7"  (1.702 m)    Wt 157 lb 3.2 oz (71.3 kg)    SpO2 99%    BMI 24.62 kg/m  Wt Readings from Last 3 Encounters:  11/20/21 157 lb 3.2 oz (71.3 kg)  09/20/21 163 lb 9.6 oz (74.2 kg)  08/28/21 165 lb 1.6 oz (74.9 kg)    Health Maintenance Due  Topic Date Due   COVID-19 Vaccine (3 - Booster for Moderna series) 06/09/2020   HPV VACCINES (3 - 3-dose series) 11/23/2021       Topic Date Due   HPV VACCINES (3 - 3-dose series) 11/23/2021     No results found for: TSH Lab Results  Component Value Date   WBC 10.8 05/23/2021   HGB 11.0 (L) 05/23/2021   HCT 37.5 05/23/2021   MCV 70.8 (L) 05/23/2021   PLT 442 (H) 05/23/2021   Lab Results  Component Value Date   NA 138 05/23/2021   K 4.1 05/23/2021   CO2 27 05/23/2021   GLUCOSE 74 05/23/2021   BUN 7 05/23/2021   CREATININE 0.83 05/23/2021   BILITOT 0.5 05/23/2021   ALKPHOS 59 12/10/2020   AST 12 05/23/2021   ALT 15 05/23/2021   PROT 7.5 05/23/2021   ALBUMIN 3.9 12/10/2020    CALCIUM 9.6 05/23/2021   ANIONGAP 8 12/10/2020   EGFR 103 05/23/2021   No results  found for: CHOL No results found for: HDL No results found for: LDLCALC No results found for: TRIG No results found for: CHOLHDL No results found for: HGBA1C     Assessment & Plan:   1. Candidiasis: Skin changes consistent with yeast infection, powder and cream sent to pharmacy, follow up as needed.   - nystatin (MYCOSTATIN/NYSTOP) powder; Apply 1 application topically 3 (three) times daily.  Dispense: 15 g; Refill: 0 - nystatin cream (MYCOSTATIN); Apply 1 application topically 2 (two) times daily.  Dispense: 30 g; Refill: 0   Teodora Medici, DO

## 2021-11-22 ENCOUNTER — Encounter: Payer: Self-pay | Admitting: Family Medicine

## 2021-11-22 ENCOUNTER — Ambulatory Visit: Payer: Medicaid Other | Admitting: Family Medicine

## 2021-11-22 VITALS — BP 118/74 | HR 100 | Resp 16 | Ht 67.0 in | Wt 160.0 lb

## 2021-11-22 DIAGNOSIS — M25512 Pain in left shoulder: Secondary | ICD-10-CM | POA: Diagnosis not present

## 2021-11-22 NOTE — Progress Notes (Signed)
Name: Melinda Snyder   MRN: [id number]    DOB: June 13, 2001   Date:11/22/2021       Progress Note  Subjective  Chief Complaint  Follow Up-Shoulder Injury  HPI  She developed left shoulder pain on 10/31/2020 while at work, she picked up a box from the belt to place on a wrack and developed a pinch sensation on left posterior shoulder. 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 Initially it was going to be a workman's comp claim but it was denied. She was working only 5 hours a day but feeling well now and ready to resume regular schedule. She used topical voltaren gel , used heat and ice and prn biofreeze , she is still using medication but is doing well.    Patient Active Problem List   Diagnosis Date Noted   Anxiety 09/20/2021   Headache 06/06/2021   Allergic rhinitis due to allergen 11/21/2018   Asthma without status asthmaticus 11/21/2018    Past Surgical History:  Procedure Laterality Date   NO PAST SURGERIES      Family History  Problem Relation Age of Onset   Hypertension Mother    Iron deficiency Mother    Heart disease Father    Kidney disease Father     Social History   Tobacco Use   Smoking status: Never   Smokeless tobacco: Never  Substance Use Topics   Alcohol use: No     Current Outpatient Medications:    albuterol (VENTOLIN HFA) 108 (90 Base) MCG/ACT inhaler, INHALE 2 PUFFS EVERY 4 HOURS AS NEEDED, Disp: , Rfl:    busPIRone (BUSPAR) 5 MG tablet, TAKE 1 TABLET BY MOUTH TWICE A DAY, Disp: 60 tablet, Rfl: 2   Diclofenac Sodium 3 % GEL, Apply 1 application topically 2 (two) times daily., Disp: , Rfl:    fluticasone (FLONASE) 50 MCG/ACT nasal spray, Place into both nostrils daily., Disp: , Rfl:    fluticasone-salmeterol (ADVAIR) 100-50 MCG/ACT  AEPB, Inhale 1 puff into the lungs 2 (two) times daily., Disp: 60 each, Rfl: 11   Iron, Ferrous Sulfate, 325 (65 Fe) MG TABS, , Disp: , Rfl:    levocetirizine (XYZAL) 5 MG tablet, Take 1 tablet (5 mg total) by mouth every evening., Disp: 90 tablet, Rfl: 1   montelukast (SINGULAIR) 10 MG tablet, TAKE 1 TABLET BY MOUTH EVERYDAY AT BEDTIME, Disp: 90 tablet, Rfl: 0   nystatin (MYCOSTATIN/NYSTOP) powder, Apply 1 application topically 3 (three) times daily., Disp: 15 g, Rfl: 0   nystatin cream (MYCOSTATIN), Apply 1 application topically 2 (two) times daily., Disp: 30 g, Rfl: 0   sertraline (ZOLOFT) 50 MG tablet, Take 1 tablet (50 mg total) by mouth daily., Disp: 90 tablet, Rfl: 0   TRI-LO-MARZIA 0.18/0.215/0.25 MG-25 MCG tab, TAKE 1 TABLET BY MOUTH EVERY DAY, Disp: 84 tablet, Rfl: 1  Allergies  Allergen Reactions   Grass Extracts [Gramineae Pollens]    Tree Extract     I personally reviewed active problem list, medication list, allergies, family history, social history, health maintenance with the patient/caregiver today.   ROS  Ten systems reviewed and is negative except as mentioned in HPI   Objective  Vitals:   11/22/21 1415  BP: 118/74  Pulse: 100  Resp:  16  SpO2: 100%  Weight: 160 lb (72.6 kg)  Height: 5\' 7"  (1.702 m)    Body mass index is 25.06 kg/m.  Physical Exam  Constitutional: Patient appears well-developed and well-nourished. No distress.  HEENT: head atraumatic, normocephalic, pupils equal and reactive to light, neck supple Cardiovascular: Normal rate, regular rhythm and normal heart sounds.  No murmur heard. No BLE edema. Muscular skeletal: normal rom of neck, normal shoulder exam, negative empty can sign Pulmonary/Chest: Effort normal and breath sounds normal. No respiratory distress. Abdominal: Soft.  There is no tenderness. Psychiatric: Patient has a normal mood and affect. behavior is normal. Judgment and thought content normal.   PHQ2/9: Depression screen  [facility] 2/9 11/22/2021 10/10/2021 09/20/2021 09/20/2021 08/28/2021  Decreased Interest 0 2 1 1 1   Down, Depressed, Hopeless 0 1 0 0 1  PHQ - 2 Score 0 3 1 1 2   Altered sleeping 0 3 0 0 0  Tired, decreased energy 0 1 1 1  0  Change in appetite 1 3 1 1  0  Feeling bad or failure about yourself  0 1 1 0 0  Trouble concentrating 0 3 1 1  0  Moving slowly or fidgety/restless 0 3 1 1  0  Suicidal thoughts 0 0 0 0 0  PHQ-9 Score 1 17 6 5 2   Difficult doing work/chores - Somewhat difficult - Somewhat difficult Not difficult at all  Some recent data might be hidden    phq 9 is negative   Fall Risk: Fall Risk  11/22/2021 10/10/2021 09/20/2021 08/28/2021 07/03/2021  Falls in the past year? 0 0 0 0 0  Number falls in past yr: 0 0 0 0 0  Injury with Fall? 0 0 0 0 0  Risk for fall due to : No Fall Risks - - - -  Follow up Falls prevention discussed Falls evaluation completed - Falls evaluation completed Falls evaluation completed      Functional Status Survey: Is the patient deaf or have difficulty hearing?: No Does the patient have difficulty seeing, even when wearing glasses/contacts?: No Does the patient have difficulty concentrating, remembering, or making decisions?: No Does the patient have difficulty walking or climbing stairs?: No Does the patient have difficulty dressing or bathing?: No Does the patient have difficulty doing errands alone such as visiting a doctor's office or shopping?: No    Assessment & Plan  1. Acute pain of left shoulder  May return to work without restriction

## 2021-11-22 NOTE — Patient Instructions (Signed)
Rotator Cuff Tear Rehab After Surgery °Ask your health care provider which exercises are safe for you. Do exercises exactly as told by your health care provider and adjust them as directed. It is normal to feel mild stretching, pulling, tightness, or discomfort as you do these exercises. Stop right away if you feel sudden pain or your pain gets worse. Do not begin these exercises until told by your health care provider. °Stretching and range-of-motion exercises °These exercises warm up your muscles and joints and improve the movement and flexibility of your shoulder. These exercises also help to relieve pain. °Shoulder pendulum °In this exercise, you let the injured arm dangle toward the floor and then swing it like a clock pendulum. °Stand near a table or counter that you can hold onto for balance. °Bend forward at the waist and let your left / right arm hang straight down. Use your other arm to support you and help you stay balanced. °Relax your left / right arm and shoulder muscles, and move your hips and your trunk so your left / right arm swings freely. Your arm should swing because of the motion of your body, not because you are using your arm or shoulder muscles. °Keep moving your hips and trunk so your arm swings in the following directions, as told by your health care provider: °Side to side. °Forward and backward. °In clockwise and counterclockwise circles. °Slowly return to the starting position. °Repeat __________ times, or for __________ seconds per direction. Complete this exercise __________ times a day. °Shoulder flexion, seated °This exercise is sometimes called table slides. In this exercise, you raise your arm in front of your body until you feel a stretch in your injured shoulder. °Sit in a stable chair so your left / right forearm can rest on a flat surface. Your elbow should rest at a height that keeps your upper arm next to your body. °Keeping your left / right shoulder relaxed, lean forward  at the waist and let your hand slide forward (flexion). Stop when you feel a stretch in your shoulder, or when you reach the angle that is recommended by your health care provider. °Hold for __________ seconds. °Slowly return to the starting position. °Repeat __________ times. Complete this exercise __________ times a day. °Shoulder flexion, standing °In this exercise, you raise your arm in front of your body (flexion) until you feel a stretch in your injured shoulder. °Stand and hold a broomstick, a cane, or a similar object. Place your hands a little more than shoulder-width apart on the object. Your left / right hand should be palm-up, and your other hand should be palm-down. °Keep your elbow straight and your shoulder muscles relaxed. Push the stick up with your healthy arm to raise your left / right arm in front of your body, and then over your head until you feel a stretch in your shoulder. °Avoid shrugging your shoulder while you raise your arm. Keep your shoulder blade tucked down toward the middle of your back. °Keep your left / right shoulder muscles relaxed. °Hold for __________ seconds. °Slowly return to the starting position. °Repeat __________ times. Complete this exercise __________ times a day. °Shoulder abduction, active-assisted °You will need a stick, broom handle, or similar object to help you (assist) in doing this exercise. °Lie on your back. This is the supine position. Hold a broomstick, a cane, or a similar object. °Place your hands a little more than shoulder-width apart on the object. Your left / right hand should   be palm-up, and your other hand should be palm-down. °Keeping your shoulder relaxed, push the stick to raise your left / right arm out to your side (abduction) and then over your head. Use your other hand to help move the stick. Stop when you feel a stretch in your shoulder, or when you reach the angle that is recommended by your health care provider. °Avoid shrugging your  shoulder while you raise your arm. Keep your shoulder blade tucked down toward the middle of your back. °Hold for __________ seconds. °Slowly return to the starting position. °Repeat __________ times. Complete this exercise __________ times a day. °Shoulder flexion, active-assisted ° °Lie on your back. You may bend your knees for comfort. °Hold a broomstick, a cane, or a similar object so that your hands are about shoulder-width apart. Your palms should face toward your feet. °Raise your left / right arm over your head, then behind your head toward the floor (flexion). Use your other hand to help you do this (active-assisted). Stop when you feel a gentle stretch in your shoulder, or when you reach the angle that is recommended by your health care provider. °Hold for __________ seconds. °Use the stick and your other arm to help you return your left / right arm to the starting position. °Repeat __________ times. Complete this exercise __________ times a day. °External rotation ° °Sit in a stable chair without armrests, or stand up. °Tuck a soft object, such as a folded towel or a small ball, under your left / right upper arm. °Hold a broomstick, a cane, or a similar object with your palms face-down, toward the floor. Bend your elbows to a 90-degree angle (right angle), and keep your hands about shoulder-width apart. °Straighten your healthy arm and push the stick across your body, toward your left / right side. Keep your left / right arm bent. This will rotate your left / right forearm away from your body (external rotation). °Hold for __________ seconds. °Slowly return to the starting position. °Repeat __________ times. Complete this exercise __________ times a day. °Strengthening exercises °These exercises build strength and endurance in your shoulder. Endurance is the ability to use your muscles for a long time, even after they get tired. Do not start doing these exercises until your health care provider  approves. °Shoulder flexion, isometric °Stand or sit in a doorway, facing the door frame. °Keep your left / right arm straight and make a gentle fist with your hand. Place your fist against the door frame. Only your fist should be touching the frame. Keep your upper arm at your side. °Gently press your fist against the door frame, as if you are trying to raise your arm above your head (isometric shoulder flexion). °Avoid shrugging your shoulder while you press your hand into the door frame. Keep your shoulder blade tucked down toward the middle of your back. °Hold for __________ seconds. °Slowly release the tension, and relax your muscles completely before you repeat the exercise. °Repeat __________ times. Complete this exercise __________ times a day. °Shoulder abduction, isometric ° °Stand or sit in a doorway. Your left / right arm should be closest to the door frame. °Keep your left / right arm straight, and place the back of your hand against the door frame. Only your hand should be touching the frame. Keep the rest of your arm close to your side. °Gently press the back of your hand against the door frame, as if you are trying to raise your arm out   to the side (isometric shoulder abduction). °Avoid shrugging your shoulder while you press your hand into the door frame. Keep your shoulder blade tucked down toward the middle of your back. °Hold for __________ seconds. °Slowly release the tension, and relax your muscles completely before you repeat the exercise. °Repeat __________ times. Complete this exercise __________ times a day. °Internal rotation, isometric °This is an exercise in which you press your palm against a door frame without moving your shoulder joint (isometric). °Stand or sit in a doorway, facing the door frame. °Bend your left / right elbow, and place the palm of your hand against the door frame. Only your palm should be touching the frame. Keep your upper arm at your side. °Gently press your hand  against the door frame, as if you are trying to push your arm toward your abdomen (internal rotation). Gradually increase the pressure until you are pressing as hard as you can. Stop increasing the pressure if you feel shoulder pain. °Avoid shrugging your shoulder while you press your hand into the door frame. Keep your shoulder blade tucked down toward the middle of your back. °Hold for __________ seconds. °Slowly release the tension, and relax your muscles completely before you repeat the exercise. °Repeat __________ times. Complete this exercise __________ times a day. °External rotation, isometric °This is an exercise in which you press the back of your wrist against a door frame without moving your shoulder joint (isometric). °Stand or sit in a doorway, facing the door frame. °Bend your left / right elbow and place the back of your wrist against the door frame. Only the back of your wrist should be touching the frame. Keep your upper arm at your side. °Gently press your wrist against the door frame, as if you are trying to push your arm away from your abdomen (external rotation). Gradually increase the pressure until you are pressing as hard as you can. Stop increasing the pressure if you feel pain. °Avoid shrugging your shoulder while you press your wrist into the door frame. Keep your shoulder blade tucked down toward the middle of your back. °Hold for __________ seconds. °Slowly release the tension, and relax your muscles completely before you repeat the exercise. °Repeat __________ times. Complete this exercise __________ times a day. °This information is not intended to replace advice given to you by your health care provider. Make sure you discuss any questions you have with your health care provider. °Document Revised: 04/07/2020 Document Reviewed: 04/07/2020 °Elsevier Patient Education © 2022 [employer] ° °

## 2021-12-28 ENCOUNTER — Other Ambulatory Visit: Payer: Self-pay | Admitting: Family Medicine

## 2021-12-28 DIAGNOSIS — F419 Anxiety disorder, unspecified: Secondary | ICD-10-CM

## 2021-12-28 NOTE — Telephone Encounter (Signed)
Requested medication (s) are due for refill today:   Not sure ? ?Requested medication (s) are on the active medication list:   Yes as a historical med. ? ?Future visit scheduled:   Yes in 1 wk with Leisa ? ? ?Last ordered: 11/20/2021 #90, 0 refills ? ?Returned because pharmacy needing a new Rx with instructions for 1 50 mg tablet a day.    ? ?Requested Prescriptions  ?Pending Prescriptions Disp Refills  ? sertraline (ZOLOFT) 50 MG tablet 90 tablet 0  ?  Sig: Take 1 tablet (50 mg total) by mouth daily.  ?  ? Not Delegated - Psychiatry:  Antidepressants - SSRI - sertraline Failed - 12/28/2021  9:50 AM  ?  ?  Failed - This refill cannot be delegated  ?  ?  Passed - AST in normal range and within 360 days  ?  AST  ?Date Value Ref Range Status  ?05/23/2021 12 10 - 30 U/L Final  ?  ?  ?  ?  Passed - ALT in normal range and within 360 days  ?  ALT  ?Date Value Ref Range Status  ?05/23/2021 15 6 - 29 U/L Final  ?  ?  ?  ?  Passed - Completed PHQ-2 or PHQ-9 in the last 360 days  ?  ?  Passed - Valid encounter within last 6 months  ?  Recent Outpatient Visits   ? ?      ? 1 month ago Acute pain of left shoulder  ? [facility] Alba Cory, MD  ? 1 month ago Candidiasis  ? [facility] Margarita Mail, DO  ? 2 months ago Anxiety  ? [facility] Della Goo F, FNP  ? 3 months ago Anxiety  ? [facility] Ellwood Dense M, DO  ? 4 months ago Neck pain on right side  ? [facility] Berniece Salines, FNP  ? ?  ?  ?Future Appointments   ? ?        ? In 1 week Danelle Berry, PA-C [facility]  ? ?  ? ?  ?  ?  ? ?

## 2021-12-28 NOTE — Telephone Encounter (Signed)
Medication Refill - Medication: sertraline (ZOLOFT) 50 MG tablet  ?Pharmacy didn't received RX from 1.30.23/ and she sent a request through mychart but the pharmacy choice was not correct in that request / please advise  ? ?Has the patient contacted their pharmacy? Yes.   ?(Agent: If no, request that the patient contact the pharmacy for the refill. If patient does not wish to contact the pharmacy document the reason why and proceed with request.) ?(Agent: If yes, when and what did the pharmacy advise?) Pharmacy needs a new RX with directions of take 1 50MG  tab a day  ? ?Preferred Pharmacy (with phone number or street name): [id number] - [place], [place]  ?[address], [place] [address]  ?Phone:  [phone]  Fax:  [phone] ?Has the patient been seen for an appointment in the last year OR does the patient have an upcoming appointment? Yes.   ? ?Agent: Please be advised that RX refills may take up to 3 business days. We ask that you follow-up with your pharmacy. ?

## 2022-01-04 ENCOUNTER — Ambulatory Visit: Payer: Medicaid Other | Admitting: Family Medicine

## 2022-01-04 ENCOUNTER — Other Ambulatory Visit (HOSPITAL_COMMUNITY)
Admission: RE | Admit: 2022-01-04 | Discharge: 2022-01-04 | Disposition: A | Discharge status: Home / Self Care | Payer: Medicaid Other | Source: Ambulatory Visit | Attending: Family Medicine | Admitting: Family Medicine

## 2022-01-04 ENCOUNTER — Encounter: Payer: Self-pay | Admitting: Family Medicine

## 2022-01-04 VITALS — BP 104/70 | HR 96 | Temp 98.1°F | Resp 16 | Ht 67.0 in | Wt 163.5 lb

## 2022-01-04 DIAGNOSIS — N760 Acute vaginitis: Secondary | ICD-10-CM | POA: Diagnosis not present

## 2022-01-04 DIAGNOSIS — Z1159 Encounter for screening for other viral diseases: Secondary | ICD-10-CM

## 2022-01-04 DIAGNOSIS — F419 Anxiety disorder, unspecified: Secondary | ICD-10-CM

## 2022-01-04 DIAGNOSIS — J4530 Mild persistent asthma, uncomplicated: Secondary | ICD-10-CM

## 2022-01-04 DIAGNOSIS — D649 Anemia, unspecified: Secondary | ICD-10-CM

## 2022-01-04 DIAGNOSIS — J302 Other seasonal allergic rhinitis: Secondary | ICD-10-CM | POA: Diagnosis not present

## 2022-01-04 DIAGNOSIS — D500 Iron deficiency anemia secondary to blood loss (chronic): Secondary | ICD-10-CM

## 2022-01-04 MED ORDER — SERTRALINE HCL 50 MG PO TABS: 50.0000 mg | ORAL_TABLET | Freq: Every day | ORAL | 3 refills | Status: DC

## 2022-01-04 MED ORDER — LEVOCETIRIZINE DIHYDROCHLORIDE 5 MG PO TABS: 5.0000 mg | ORAL_TABLET | Freq: Every evening | ORAL | 3 refills | Status: DC

## 2022-01-04 MED ORDER — BUSPIRONE HCL 5 MG PO TABS: 5.0000 mg | ORAL_TABLET | Freq: Two times a day (BID) | ORAL | 2 refills | Status: DC

## 2022-01-04 MED ORDER — MONTELUKAST SODIUM 10 MG PO TABS: ORAL_TABLET | ORAL | 3 refills | Status: DC

## 2022-01-04 NOTE — Assessment & Plan Note (Signed)
Well controlled with current meds - singulair, allergy meds, advair and rescue inhaler ? ? 01/04/22 1044  ?Asthma History  ?Symptoms 0-2 days/week  ?Nighttime Awakenings 0-2/month  ?Asthma interference with normal activity No limitations  ?SABA use (not for EIB) 0-2 days/wk  ?Risk: Exacerbations requiring oral systemic steroids 0-1 / year  ?Asthma Severity Intermittent  ? ?Melinda Snyder has various triggers, some unavoidable - like temperature and weather changes, rarely uses rescue inhaler, cannot recall last need for prednisone ?Encouraged managing allergies as best as Melinda Snyder can to help keep asthma controlled ?Discussed signs/sx of uncontrolled asthma that needs medication adjustment, lungs clear, pt declined and refills for inhalers ?

## 2022-01-04 NOTE — Progress Notes (Signed)
? ?Name: Melinda PardonHeaven M Snyder   MRN: [id number]    DOB: August 20, 2001   Date:01/04/2022 ? ?     Progress Note ? ?Chief Complaint  ?Patient presents with  ? Follow-up  ? Anxiety  ? ? ? ?Subjective:  ? ?Melinda Snyder is a 21 y.o. female, presents to clinic for f/up on newer meds for managing anxiety sx ?On zoloft and buspar, was supposed to start zoloft at 25 mg and increase to 50, but Rx sent to pharmacy never got adjusted and she has run out, she take in the am, felt like it worked well ?Buspar she is taking BID, 5 mg, helps her feel more calm, no SE or concerns for either phq 9 and GAD 7 reviewed ? ?[facility]/04/2022 11/22/2021 10/10/2021  ?Decreased Interest 1 0 2  ?Down, Depressed, Hopeless 0 0 1  ?PHQ - 2 Score 1 0 3  ?Altered sleeping 2 0 3  ?Tired, decreased energy 1 0 1  ?Change in appetite 2 1 3   ?Feeling bad or failure about yourself  0 0 1  ?Trouble concentrating 2 0 3  ?Moving slowly or fidgety/restless 3 0 3  ?Suicidal thoughts 0 0 0  ?PHQ-9 Score 11 1 17   ?Difficult doing work/chores Somewhat difficult - Somewhat difficult  ?Some recent data might be hidden  ? ?GAD 7 : Generalized Anxiety Score 01/04/2022 10/10/2021 09/20/2021 09/20/2021  ?Nervous, Anxious, on Edge 0 2 2 2   ?Control/stop worrying 0 1 3 2   ?Worry too much - different things 1 1 3 3   ?Trouble relaxing 1 3 1 2   ?Restless 0 0 1 1  ?Easily annoyed or irritable 0 2 1 2   ?Afraid - awful might happen 0 1 2 2   ?Total GAD 7 Score 2 10 13 14   ?Anxiety Difficulty Somewhat difficult Somewhat difficult - Very difficult  ? ? ?Iron deficiency anemia -  ?On iron supplement and OCP, menses regular and lighter ?Hemoglobin  ?Date Value Ref Range Status  ?05/23/2021 11.0 (L) 11.7 - 15.5 g/dL Final  ?40/98/119102/19/2022 9.7 (L) 12.0 - 15.0 g/dL Final  ? ?Lab Results  ?Component Value Date  ? IRON 32 (L) 07/03/2021  ? TIBC 465 (H) 07/03/2021  ? FERRITIN 4 (L) 07/03/2021  ? ?Asthma allergies: ?On xyzal, singular, maintenance inhaler and rescue inhaler, weather  changes and season changes trigger asthma, no recent exacerbations and she rarely needs prednisone ? ?Mild vaginal odor x 3 days, no itching, vulvovaginal rash, swelling, lesions, does want to check swab, prior yeast on testing ? ? ? ? ? ? ?Current Outpatient Medications:  ?  albuterol (VENTOLIN HFA) 108 (90 Base) MCG/ACT inhaler, INHALE 2 PUFFS EVERY 4 HOURS AS NEEDED, Disp: , Rfl:  ?  busPIRone (BUSPAR) 5 MG tablet, TAKE 1 TABLET BY MOUTH TWICE A DAY, Disp: 60 tablet, Rfl: 2 ?  Diclofenac Sodium 3 % GEL, Apply 1 application topically 2 (two) times daily., Disp: , Rfl:  ?  fluticasone (FLONASE) 50 MCG/ACT nasal spray, Place into both nostrils daily., Disp: , Rfl:  ?  fluticasone-salmeterol (ADVAIR) 100-50 MCG/ACT AEPB, Inhale 1 puff into the lungs 2 (two) times daily., Disp: 60 each, Rfl: 11 ?  Iron, Ferrous Sulfate, 325 (65 Fe) MG TABS, , Disp: , Rfl:  ?  levocetirizine (XYZAL) 5 MG tablet, Take 1 tablet (5 mg total) by mouth every evening., Disp: 90 tablet, Rfl: 1 ?  montelukast (SINGULAIR) 10 MG tablet, TAKE 1 TABLET BY MOUTH EVERYDAY AT BEDTIME,  Disp: 90 tablet, Rfl: 0 ?  nystatin (MYCOSTATIN/NYSTOP) powder, Apply 1 application topically 3 (three) times daily., Disp: 15 g, Rfl: 0 ?  nystatin cream (MYCOSTATIN), Apply 1 application topically 2 (two) times daily., Disp: 30 g, Rfl: 0 ?  sertraline (ZOLOFT) 50 MG tablet, Take 1 tablet (50 mg total) by mouth daily., Disp: 90 tablet, Rfl: 0 ?  TRI-LO-MARZIA 0.18/0.215/0.25 MG-25 MCG tab, TAKE 1 TABLET BY MOUTH EVERY DAY, Disp: 84 tablet, Rfl: 1 ? ?Patient Active Problem List  ? Diagnosis Date Noted  ? Iron deficiency anemia due to chronic blood loss 01/04/2022  ? Mild persistent asthma without complication 01/04/2022  ? Anxiety disorder, unspecified 09/20/2021  ? Allergic rhinitis due to allergen 11/21/2018  ? Asthma without status asthmaticus 11/21/2018  ? ? ?Past Surgical History:  ?Procedure Laterality Date  ? NO PAST SURGERIES    ? ? ?Family History  ?Problem  Relation Age of Onset  ? Hypertension Mother   ? Iron deficiency Mother   ? Heart disease Father   ? Kidney disease Father   ? ? ?Social History  ? ?Tobacco Use  ? Smoking status: Never  ? Smokeless tobacco: Never  ?Vaping Use  ? Vaping Use: Never used  ?Substance Use Topics  ? Alcohol use: No  ? Drug use: No  ?  ? ?Allergies  ?Allergen Reactions  ? Grass Extracts [Gramineae Pollens]   ? Tree Extract   ? ? ?Health Maintenance  ?Topic Date Due  ? Hepatitis C Screening  Never done  ? HPV VACCINES (3 - 3-dose series) 11/23/2021  ? COVID-19 Vaccine (3 - Booster for Moderna series) 01/20/2022 (Originally 06/09/2020)  ? TETANUS/TDAP  05/24/2031  ? INFLUENZA VACCINE  Completed  ? HIV Screening  Completed  ? ? ?Chart Review Today: ?I personally reviewed active problem list, medication list, allergies, family history, social history, health maintenance, notes from last encounter, lab results, imaging with the patient/caregiver today. ? ? ?Review of Systems  ?Constitutional: Negative.   ?HENT: Negative.    ?Eyes: Negative.   ?Respiratory: Negative.    ?Cardiovascular: Negative.   ?Gastrointestinal: Negative.   ?Endocrine: Negative.   ?Genitourinary: Negative.   ?Musculoskeletal: Negative.   ?Skin: Negative.   ?Allergic/Immunologic: Negative.   ?Neurological: Negative.   ?Hematological: Negative.   ?Psychiatric/Behavioral: Negative.    ?All other systems reviewed and are negative.  ? ?Objective:  ? ?Vitals:  ? 01/04/22 0847  ?BP: 104/70  ?Pulse: 96  ?Resp: 16  ?Temp: 98.1 ?F (36.7 ?C)  ?TempSrc: Oral  ?SpO2: 99%  ?Weight: 163 lb 8 oz (74.2 kg)  ?Height: 5\' 7"  (1.702 m)  ? ? ?Body mass index is 25.61 kg/m?. ? ?Physical Exam ?Vitals and nursing note reviewed.  ?Constitutional:   ?   General: She is not in acute distress. ?   Appearance: Normal appearance. She is well-developed. She is not ill-appearing, toxic-appearing or diaphoretic.  ?HENT:  ?   Head: Normocephalic and atraumatic.  ?   Nose: Congestion present. No rhinorrhea.   ?   Right Turbinates: Enlarged, swollen and pale.  ?   Left Turbinates: Enlarged, swollen and pale.  ?   Mouth/Throat:  ?   Mouth: Mucous membranes are moist.  ?   Pharynx: Oropharynx is clear. No oropharyngeal exudate or posterior oropharyngeal erythema.  ?Eyes:  ?   General: No scleral icterus.    ?   Right eye: No discharge.     ?   Left eye: No discharge.  ?  Conjunctiva/sclera: Conjunctivae normal.  ?   Pupils: Pupils are equal, round, and reactive to light.  ?   Comments: no pallor  ?Neck:  ?   Trachea: No tracheal deviation.  ?Cardiovascular:  ?   Rate and Rhythm: Normal rate and regular rhythm.  ?   Pulses: Normal pulses.  ?   Heart sounds: Normal heart sounds. No murmur heard. ?  No friction rub. No gallop.  ?Pulmonary:  ?   Effort: Pulmonary effort is normal. No respiratory distress.  ?   Breath sounds: Normal breath sounds. No stridor. No wheezing, rhonchi or rales.  ?Skin: ?   General: Skin is warm and dry.  ?   Coloration: Skin is not jaundiced or pale.  ?   Findings: No rash.  ?Neurological:  ?   Mental Status: She is alert. Mental status is at baseline.  ?   Motor: No abnormal muscle tone.  ?   Coordination: Coordination normal.  ?Psychiatric:     ?   Mood and Affect: Mood normal.     ?   Behavior: Behavior normal.  ?  ? ? ? ? ?Assessment & Plan:  ? ?Problem List Items Addressed This Visit   ? ?  ? Respiratory  ? Allergic rhinitis due to allergen  ?  Continue 2nd gen antihistamine and singulair, may switch xyzal if needed if it become ineffective ?Currently well controlled ?Enlarged nasal turbinates b/l w/o recurrent nasal sx or bacterial sinusitis ?  ?  ? Relevant Medications  ? levocetirizine (XYZAL) 5 MG tablet  ? montelukast (SINGULAIR) 10 MG tablet  ? Mild persistent asthma without complication  ?  Well controlled with current meds - singulair, allergy meds, advair and rescue inhaler ? ? 01/04/22 1044  ?Asthma History  ?Symptoms 0-2 days/week  ?Nighttime Awakenings 0-2/month  ?Asthma  interference with normal activity No limitations  ?SABA use (not for EIB) 0-2 days/wk  ?Risk: Exacerbations requiring oral systemic steroids 0-1 / year  ?Asthma Severity Intermittent  ? ?She has various triggers, som

## 2022-01-04 NOTE — Assessment & Plan Note (Signed)
Continue 2nd gen antihistamine and singulair, may switch xyzal if needed if it become ineffective ?Currently well controlled ?Enlarged nasal turbinates b/l w/o recurrent nasal sx or bacterial sinusitis ?

## 2022-01-04 NOTE — Assessment & Plan Note (Signed)
Pt here for refill on zoloft, did well on 50 mg, also using buspar 5mg  BID, controls sx well ?GAD was reviewed and neg today ?PHQ 9 positive however pt states she does not feel down or depressed, will monitor ?Depression screen [facility] 2/9 01/04/2022 11/22/2021 10/10/2021  ?Decreased Interest 1 0 2  ?Down, Depressed, Hopeless 0 0 1  ?PHQ - 2 Score 1 0 3  ?Altered sleeping 2 0 3  ?Tired, decreased energy 1 0 1  ?Change in appetite 2 1 3   ?Feeling bad or failure about yourself  0 0 1  ?Trouble concentrating 2 0 3  ?Moving slowly or fidgety/restless 3 0 3  ?Suicidal thoughts 0 0 0  ?PHQ-9 Score 11 1 17   ?Difficult doing work/chores Somewhat difficult - Somewhat difficult  ?Some recent data might be hidden  ? ?May check TSH at physical? ?

## 2022-01-04 NOTE — Progress Notes (Signed)
?   01/04/22 1044  ?Asthma History  ?Symptoms 0-2 days/week  ?Nighttime Awakenings 0-2/month  ?Asthma interference with normal activity No limitations  ?SABA use (not for EIB) 0-2 days/wk  ?Risk: Exacerbations requiring oral systemic steroids 0-1 / year  ?Asthma Severity Intermittent  ? ? ?

## 2022-01-04 NOTE — Assessment & Plan Note (Signed)
Mild anemia but low iron, she has been on oral iron supplement for about 6 months, on OCP w/o heavy menses ?Recheck CBC and iron panel ?She feels cold easily, but no other sx, no pallor on exam, VSS ?

## 2022-01-05 LAB — CBC WITH DIFFERENTIAL/PLATELET
Absolute Monocytes: 340 cells/uL (ref 200–950)
Basophils Absolute: 59 cells/uL (ref 0–200)
Basophils Relative: 1.1 %
Eosinophils Absolute: 108 cells/uL (ref 15–500)
Eosinophils Relative: 2 %
HCT: 35.7 % (ref 35.0–45.0)
Hemoglobin: 10.6 g/dL — ABNORMAL LOW (ref 11.7–15.5)
Lymphs Abs: 2257 cells/uL (ref 850–3900)
MCH: 21.4 pg — ABNORMAL LOW (ref 27.0–33.0)
MCHC: 29.7 g/dL — ABNORMAL LOW (ref 32.0–36.0)
MCV: 72 fL — ABNORMAL LOW (ref 80.0–100.0)
MPV: 10.5 fL (ref 7.5–12.5)
Monocytes Relative: 6.3 %
Neutro Abs: 2635 cells/uL (ref 1500–7800)
Neutrophils Relative %: 48.8 %
Platelets: 364 10*3/uL (ref 140–400)
RBC: 4.96 10*6/uL (ref 3.80–5.10)
RDW: 16.7 % — ABNORMAL HIGH (ref 11.0–15.0)
Total Lymphocyte: 41.8 %
WBC: 5.4 10*3/uL (ref 3.8–10.8)

## 2022-01-05 LAB — CERVICOVAGINAL ANCILLARY ONLY
Bacterial Vaginitis (gardnerella): NEGATIVE
Candida Glabrata: NEGATIVE
Candida Vaginitis: NEGATIVE
Comment: NEGATIVE
Comment: NEGATIVE
Comment: NEGATIVE

## 2022-01-05 LAB — IRON,TIBC AND FERRITIN PANEL
%SAT: 3 % (calc) — ABNORMAL LOW (ref 16–45)
Ferritin: 3 ng/mL — ABNORMAL LOW (ref 16–154)
Iron: 18 ug/dL — ABNORMAL LOW (ref 40–190)
TIBC: 541 mcg/dL (calc) — ABNORMAL HIGH (ref 250–450)

## 2022-01-08 ENCOUNTER — Telehealth: Payer: Self-pay

## 2022-01-08 NOTE — Telephone Encounter (Signed)
Spoke to pt, pt states she is unable to get her Sertraline due to having her dose change and took up her pills. Pharmacy told her her next refill is until 4/11. I called pharmacy spoke to Sireatta she stated she will call Medicaid and to see if she can override and get her medication sooner. Pharmacist stated last prescription they received was 11/10/21 with instructions of taking 0.5mg  (25mg ) daily. On my behalf it also stated that but also underneath instructions it stated patient taking differently 50mg  daily. Also pharmacist stated they never received the note or a rx stating pt was taking the whole 50mg . ?

## 2022-01-08 NOTE — Telephone Encounter (Signed)
Pt called in to return the call, but the office was busy, pt states there has been a mix up, and she is seeing everything but still unable to pick up. Please advise. (Pt is at work-gets off at 11:50am) ?

## 2022-01-08 NOTE — Telephone Encounter (Signed)
Called pt no answer left vm to call back  °

## 2022-01-08 NOTE — Telephone Encounter (Signed)
Called pt to let her know that Sertraline medication was sent over to her pharmacy [facility] in [place] on 01/04/2022. If Prior authorization is needed we will be receivin a form from her her pharmacy that is has been denied by her insurance and start PA. ?

## 2022-02-08 ENCOUNTER — Ambulatory Visit
Admission: RE | Admit: 2022-02-08 | Discharge: 2022-02-08 | Disposition: A | Discharge status: Home / Self Care | Payer: Medicaid Other | Source: Ambulatory Visit | Attending: Physician Assistant | Admitting: Physician Assistant

## 2022-02-08 VITALS — BP 116/70 | HR 86 | Temp 98.3°F | Resp 18 | Ht 67.0 in | Wt 160.0 lb

## 2022-02-08 DIAGNOSIS — G43809 Other migraine, not intractable, without status migrainosus: Secondary | ICD-10-CM | POA: Diagnosis not present

## 2022-02-08 DIAGNOSIS — M542 Cervicalgia: Secondary | ICD-10-CM

## 2022-02-08 MED ORDER — SUMATRIPTAN SUCCINATE 50 MG PO TABS: ORAL_TABLET | ORAL | 0 refills | Status: AC

## 2022-02-08 NOTE — ED Provider Notes (Signed)
?[facility] ? ? ? ?CSN: [id number] ?Arrival date & time: 02/08/22  1248 ? ? ?  ? ?History   ?Chief Complaint ?Chief Complaint  ?Patient presents with  ? Migraine  ?  Appt @ 1  ? ? ?HPI ?Melinda Snyder is a 21 y.o. female presenting for recurrent migraines over the past 1 year.  Patient says she has been seen by her primary care provider for this and they prescribed her sumatriptan.  She says that medication helped but they told her she was taking it too often.  She says she was having headaches near daily when they first started but now they have slowed down to about once a week.  She says it is always on the left side of her head.  It is associated with nausea and dizziness but no vision changes, photophobia or phonophobia.  Patient says the last migraine she had was a week ago.  She says she normally takes Tylenol but it does not seem to help so much.  She never takes NSAIDs as she was advised not to take them for some reason.  Patient says she spoke to someone at her primary care provider's office and advised them she was continue to have headaches so they advised her to go and get a CT scan somewhere.  Patient is here today hoping to get a CT scan.  She denies any head injuries, presyncope, syncope, severe headaches.  She is not having a headache presently.  She denies ever having any numbness, tingling or weakness.  Denies any red flag signs or symptoms.  Her headaches/migraines seem to be consistent with that of migraines.  Presently she is complaining of right-sided neck pain that she said for the past couple of days.  She says she has to lift a lot of stuff at work but denies any specific injury.  The neck is tender to touch.  She has applied heat and taken Tylenol.  No radiation of pain on extremities.  Other medical history significant for asthma and allergies.  Patient also is taking an oral contraceptive. ? ?HPI ? ?Past Medical History:  ?Diagnosis Date  ? Allergy   ? Asthma   ? Seasonal  allergies   ? ? ?Patient Active Problem List  ? Diagnosis Date Noted  ? Iron deficiency anemia due to chronic blood loss 01/04/2022  ? Mild persistent asthma without complication 01/04/2022  ? Anxiety disorder, unspecified 09/20/2021  ? Allergic rhinitis due to allergen 11/21/2018  ? ? ?Past Surgical History:  ?Procedure Laterality Date  ? NO PAST SURGERIES    ? ? ?OB History   ?No obstetric history on file. ?  ? ? ? ?Home Medications   ? ?Prior to Admission medications   ?Medication Sig Start Date End Date Taking? Authorizing Provider  ?albuterol (VENTOLIN HFA) 108 (90 Base) MCG/ACT inhaler INHALE 2 PUFFS EVERY 4 HOURS AS NEEDED 12/13/15  Yes [provider]  ?busPIRone (BUSPAR) 5 MG tablet Take 1 tablet (5 mg total) by mouth 2 (two) times daily. 01/04/22  Yes Danelle Berry, PA-C  ?Diclofenac Sodium 3 % GEL Apply 1 application topically 2 (two) times daily. 11/09/21  Yes [provider]  ?fluticasone (FLONASE) 50 MCG/ACT nasal spray Place into both nostrils daily.   Yes [provider]  ?fluticasone-salmeterol (ADVAIR) 100-50 MCG/ACT AEPB Inhale 1 puff into the lungs 2 (two) times daily. 05/23/21  Yes Danelle Berry, PA-C  ?Iron, Ferrous Sulfate, 325 (65 Fe) MG TABS  09/21/21  Yes [provider]  ?levocetirizine (XYZAL) 5 MG tablet Take 1 tablet (5 mg total) by mouth every evening. 01/04/22  Yes Danelle Berry, PA-C  ?montelukast (SINGULAIR) 10 MG tablet TAKE 1 TABLET BY MOUTH EVERYDAY AT BEDTIME 01/04/22  Yes Danelle Berry, PA-C  ?nystatin (MYCOSTATIN/NYSTOP) powder Apply 1 application topically 3 (three) times daily. 11/20/21  Yes Margarita Mail, DO  ?nystatin cream (MYCOSTATIN) Apply 1 application topically 2 (two) times daily. 11/20/21  Yes Margarita Mail, DO  ?sertraline (ZOLOFT) 50 MG tablet Take 1 tablet (50 mg total) by mouth daily. 01/04/22  Yes Danelle Berry, PA-C  ?SUMAtriptan (IMITREX) 50 MG tablet May repeat in 2 hours if headache persists or recurs. 02/08/22  Yes Shirlee Latch, PA-C  ?TRI-LO-MARZIA 0.18/0.215/0.25 MG-25 MCG tab TAKE 1 TABLET BY MOUTH EVERY DAY 10/16/21  Yes Alba Cory, MD  ? ? ?Family History ?Family History  ?Problem Relation Age of Onset  ? Hypertension Mother   ? Iron deficiency Mother   ? Heart disease Father   ? Kidney disease Father   ? ? ?Social History ?Social History  ? ?Tobacco Use  ? Smoking status: Never  ? Smokeless tobacco: Never  ?Vaping Use  ? Vaping Use: Never used  ?Substance Use Topics  ? Alcohol use: No  ? Drug use: No  ? ? ? ?Allergies   ?Grass extracts [gramineae pollens] and Tree extract ? ? ?Review of Systems ?Review of Systems  ?Constitutional:  Negative for fatigue and fever.  ?Eyes:  Negative for photophobia and visual disturbance.  ?Respiratory:  Negative for shortness of breath.   ?Cardiovascular:  Negative for chest pain and palpitations.  ?Gastrointestinal:  Positive for nausea. Negative for abdominal pain and vomiting.  ?Musculoskeletal:  Positive for neck pain. Negative for neck stiffness.  ?Neurological:  Positive for dizziness and headaches. Negative for seizures, syncope, speech difficulty, weakness, light-headedness and numbness.  ? ? ?Physical Exam ?Triage Vital Signs ?ED Triage Vitals  ?Enc Vitals Group  ?   BP 02/08/22 1321 116/70  ?   Pulse Rate 02/08/22 1321 86  ?   Resp 02/08/22 1321 18  ?   Temp 02/08/22 1321 98.3 ?F (36.8 ?C)  ?   Temp Source 02/08/22 1321 Oral  ?   SpO2 02/08/22 1321 100 %  ?   Weight 02/08/22 1319 160 lb (72.6 kg)  ?   Height 02/08/22 1319 5\' 7"  (1.702 m)  ?   Head Circumference --   ?   Peak Flow --   ?   Pain Score 02/08/22 1318 9  ?   Pain Loc --   ?   Pain Edu? --   ?   Excl. in GC? --   ? ?No data found. ? ?Updated Vital Signs ?BP 116/70 (BP Location: Left Arm)   Pulse 86   Temp 98.3 ?F (36.8 ?C) (Oral)   Resp 18   Ht 5\' 7"  (1.702 m)   Wt 160 lb (72.6 kg)   LMP 02/01/2022   SpO2 100%   BMI 25.06 kg/m?  ? ?   ? ?Physical Exam ?Vitals and nursing note reviewed.  ?Constitutional:   ?    General: She is not in acute distress. ?   Appearance: Normal appearance. She is not ill-appearing or toxic-appearing.  ?HENT:  ?   Head: Normocephalic and atraumatic.  ?   Nose: Nose normal.  ?   Mouth/Throat:  ?   Mouth: Mucous membranes are moist.  ?   Pharynx: Oropharynx  is clear.  ?Eyes:  ?   General: No scleral icterus.    ?   Right eye: No discharge.     ?   Left eye: No discharge.  ?   Extraocular Movements: Extraocular movements intact.  ?   Conjunctiva/sclera: Conjunctivae normal.  ?   Pupils: Pupils are equal, round, and reactive to light.  ?Cardiovascular:  ?   Rate and Rhythm: Normal rate and regular rhythm.  ?   Heart sounds: Normal heart sounds.  ?Pulmonary:  ?   Effort: Pulmonary effort is normal. No respiratory distress.  ?   Breath sounds: Normal breath sounds.  ?Musculoskeletal:  ?   Cervical back: Normal range of motion and neck supple. Tenderness (mild TTP right paracervical muscles and trapezius) present. No rigidity.  ?Skin: ?   General: Skin is dry.  ?Neurological:  ?   General: No focal deficit present.  ?   Mental Status: She is alert and oriented to person, place, and time. Mental status is at baseline.  ?   Cranial Nerves: No cranial nerve deficit.  ?   Motor: No weakness.  ?   Coordination: Coordination normal.  ?   Gait: Gait normal.  ?Psychiatric:     ?   Mood and Affect: Mood normal.     ?   Behavior: Behavior normal.     ?   Thought Content: Thought content normal.  ? ? ? ?UC Treatments / Results  ?Labs ?(all labs ordered are listed, but only abnormal results are displayed) ?Labs Reviewed - No data to display ? ?EKG ? ? ?Radiology ?No results found. ? ?Procedures ?Procedures (including critical care time) ? ?Medications Ordered in UC ?Medications - No data to display ? ?Initial Impression / Assessment and Plan / UC Course  ?I have reviewed the triage vital signs and the nursing notes. ? ?Pertinent labs & imaging results that were available during my care of the patient were reviewed  by me and considered in my medical decision making (see chart for details). ? ?21 year old female presenting for 1 year history of intermittent migraines with associated dizziness and nausea.  Patient has been treated

## 2022-02-08 NOTE — ED Triage Notes (Signed)
Pt c/o migraine since the end of last year. Pt was put on 2 migraine medications from her PCP. Pt states that they come and go and was told to be seen by her PCP.  ?

## 2022-02-08 NOTE — Discharge Instructions (Signed)
-  As we discussed, unfortunately we do not have CT here but I am not sure you will qualify for one anyway.  It sounds like classic migraines.  You should talk to your primary care provider about getting on a prophylactic medication or if they are not comfortable prescribing that to you, ask for a referral to a neurologist who can help you get these under control with different medications. ?- I have sent more sumatriptan in case you need it. ?-It is imperative that you talk to your primary care provider about a different type of birth control with your history of migraines.  Being on the oral contraceptives and having migraines is risky.  It puts you at risk for stroke.  There are other methods. ?- In regards to your neck pain, could be strain or tension type headache.  It is okay to take Aleve or ibuprofen every now and then for pain.  Can also take Tylenol and use muscle rubs, heating pad. ?- Go to ER for any severe headaches that are out of the ordinary or the worst you have ever had or if you develop severe dizziness, feel faint or pass out or if you have a persistent headache more than 3 days. ?

## 2022-02-23 ENCOUNTER — Ambulatory Visit: Payer: Medicaid Other | Admitting: Family Medicine

## 2022-02-23 ENCOUNTER — Encounter: Payer: Self-pay | Admitting: Family Medicine

## 2022-02-23 VITALS — BP 116/72 | HR 98 | Temp 98.5°F | Resp 16 | Ht 67.0 in | Wt 167.1 lb

## 2022-02-23 DIAGNOSIS — J4530 Mild persistent asthma, uncomplicated: Secondary | ICD-10-CM

## 2022-02-23 DIAGNOSIS — D500 Iron deficiency anemia secondary to blood loss (chronic): Secondary | ICD-10-CM

## 2022-02-23 DIAGNOSIS — R519 Headache, unspecified: Secondary | ICD-10-CM

## 2022-02-23 DIAGNOSIS — F419 Anxiety disorder, unspecified: Secondary | ICD-10-CM

## 2022-02-23 NOTE — Patient Instructions (Signed)
Head ache journal and follow up with specialists ? ?Can take excedrine migraine or the imitrex for headaches -  ?We want to be using head ache medications or over the counter medications less than 8-10 times a month ? ? ?If blood levels and iron are worse we will need to try and slow down/stop  your periods and address the blood loss right away. ? ?Hemoglobin  ?Date Value Ref Range Status  ?01/04/2022 10.6 (L) 11.7 - 15.5 g/dL Final  ?18/56/3149 70.2 (L) 11.7 - 15.5 g/dL Final  ?63/78/5885 9.7 (L) 12.0 - 15.0 g/dL Final  ? ?Lab Results  ?Component Value Date  ? IRON 18 (L) 01/04/2022  ? TIBC 541 (H) 01/04/2022  ? FERRITIN 3 (L) 01/04/2022  ? ? ?

## 2022-02-23 NOTE — Progress Notes (Signed)
k ? ? ?Patient ID: Melinda Snyder, female    DOB: [birth year], 21 y.o.   MRN: [id number] ? ?PCP: Delsa Grana, PA-C ? ?Chief Complaint  ?Patient presents with  ? Hair/Scalp Problem  ?  Onset 1 yr  ? ? ?Subjective:  ? ?Melinda Snyder is a 21 y.o. female, presents to clinic with CC of the following: ? ?HPI  ?She presents for episodic headaches and scalp pain which she feels are related to each other and have been occurring for the past year ?She will have about 3 episodes in a week, she notices onset of a headache which comes to the left side of her head feels like throbbing and sharp, seems to be exacerbated with scalp tenderness or usually shortly after her scalp where hair is manipulated.  Her headache has associated left eye visual disturbances some times a feeling of dizziness and associated nausea ?She has tried Imitrex and this does help get rid of her headaches, the only other thing that seems to help is lying down and taking a nap.  She has gone to urgent care for assessment and they told her to use Tylenol or ibuprofen. ?Prior to a year ago she did not have any history of headaches nor is there a family history of migraines ?She denies associated near syncope, slurred speech, confusion, prodromal features such as aura, phonophobia, photophobia ?She has not seen a headache specialist or neurologist ?She denies any tenderness to her temple but behind her ear on her left scalp is the area where the scalp pain is the most frequently felt ? ? ?Menses seem to be back to back now -or at least were from March to April her last menstrual period was April 13 to 19 she is on tricyclic birth control ?With increase in bleeding and longer periods she did increase her iron supplement ?started iron twice a day handeling it fine as long as she eats food first  ?Over a year ago her hemoglobin was 9.7, improved to 11 and last was 10.7 with worsening iron deficiency (labs reviewed at length today prior to appointment and with the  patient in the room) ?She does note feeling cold all the time especially noticed that she is cold even when it is warm outside ? ?Anxiety-she restarted Zoloft 50 mg about 2 months ago and feels like this is working fine her anxiety is well controlled ? ?Asthma -stable with allergy medications, Advair and PRN use of albuterol rescue inhaler ? ? ?Patient Active Problem List  ? Diagnosis Date Noted  ? Iron deficiency anemia due to chronic blood loss 01/04/2022  ? Mild persistent asthma without complication 99991111  ? Anxiety disorder, unspecified 09/20/2021  ? Allergic rhinitis due to allergen 11/21/2018  ? ? ? ? ?Current Outpatient Medications:  ?  albuterol (VENTOLIN HFA) 108 (90 Base) MCG/ACT inhaler, INHALE 2 PUFFS EVERY 4 HOURS AS NEEDED, Disp: , Rfl:  ?  busPIRone (BUSPAR) 5 MG tablet, Take 1 tablet (5 mg total) by mouth 2 (two) times daily., Disp: 60 tablet, Rfl: 2 ?  Diclofenac Sodium 3 % GEL, Apply 1 application topically 2 (two) times daily., Disp: , Rfl:  ?  fluticasone (FLONASE) 50 MCG/ACT nasal spray, Place into both nostrils daily., Disp: , Rfl:  ?  fluticasone-salmeterol (ADVAIR) 100-50 MCG/ACT AEPB, Inhale 1 puff into the lungs 2 (two) times daily., Disp: 60 each, Rfl: 11 ?  Iron, Ferrous Sulfate, 325 (65 Fe) MG TABS, , Disp: , Rfl:  ?  levocetirizine (XYZAL) 5 MG tablet, Take 1 tablet (5 mg total) by mouth every evening., Disp: 90 tablet, Rfl: 3 ?  montelukast (SINGULAIR) 10 MG tablet, TAKE 1 TABLET BY MOUTH EVERYDAY AT BEDTIME, Disp: 90 tablet, Rfl: 3 ?  nystatin (MYCOSTATIN/NYSTOP) powder, Apply 1 application topically 3 (three) times daily., Disp: 15 g, Rfl: 0 ?  nystatin cream (MYCOSTATIN), Apply 1 application topically 2 (two) times daily., Disp: 30 g, Rfl: 0 ?  sertraline (ZOLOFT) 50 MG tablet, Take 1 tablet (50 mg total) by mouth daily., Disp: 90 tablet, Rfl: 3 ?  SUMAtriptan (IMITREX) 50 MG tablet, May repeat in 2 hours if headache persists or recurs., Disp: 10 tablet, Rfl: 0 ?   TRI-LO-MARZIA 0.18/0.215/0.25 MG-25 MCG tab, TAKE 1 TABLET BY MOUTH EVERY DAY, Disp: 84 tablet, Rfl: 1 ? ? ?Allergies  ?Allergen Reactions  ? Grass Extracts [Gramineae Pollens]   ? Tree Extract   ? ? ? ?Social History  ? ?Tobacco Use  ? Smoking status: Never  ? Smokeless tobacco: Never  ?Vaping Use  ? Vaping Use: Never used  ?Substance Use Topics  ? Alcohol use: No  ? Drug use: No  ?  ? ? ?Chart Review Today: ?I personally reviewed active problem list, medication list, allergies, family history, social history, health maintenance, notes from last encounter, lab results, imaging with the patient/caregiver today. ? ? ?Review of Systems  ?Constitutional: Negative.   ?HENT: Negative.    ?Eyes: Negative.   ?Respiratory: Negative.    ?Cardiovascular: Negative.   ?Gastrointestinal: Negative.   ?Endocrine: Negative.   ?Genitourinary: Negative.   ?Musculoskeletal: Negative.   ?Skin: Negative.   ?Allergic/Immunologic: Negative.   ?Neurological: Negative.   ?Hematological: Negative.   ?Psychiatric/Behavioral: Negative.    ?All other systems reviewed and are negative. ? ?   ?Objective:  ? ?Vitals:  ? 02/23/22 1123  ?BP: 116/72  ?Pulse: 98  ?Resp: 16  ?Temp: 98.5 ?F (36.9 ?C)  ?SpO2: 99%  ?Weight: 167 lb 1.6 oz (75.8 kg)  ?Height: 5\' 7"  (1.702 m)  ?  ?Body mass index is 26.17 kg/m?. ? ?Physical Exam ?Vitals and nursing note reviewed.  ?Constitutional:   ?   General: She is not in acute distress. ?   Appearance: Normal appearance. She is well-developed. She is not ill-appearing, toxic-appearing or diaphoretic.  ?HENT:  ?   Head: Normocephalic and atraumatic. Hair is normal.  ?   Jaw: There is normal jaw occlusion.  ?   Comments: No rash or lymphadenopathy noted to scalp ?No temple ttp  ?   Right Ear: Tympanic membrane, ear canal and external ear normal.  ?   Left Ear: Tympanic membrane, ear canal and external ear normal.  ?   Nose: Nose normal. No congestion.  ?   Mouth/Throat:  ?   Mouth: Mucous membranes are moist.  ?    Pharynx: Oropharynx is clear.  ?Eyes:  ?   General:     ?   Right eye: No discharge.     ?   Left eye: No discharge.  ?   Conjunctiva/sclera: Conjunctivae normal.  ?   Pupils: Pupils are equal, round, and reactive to light.  ?Neck:  ?   Trachea: No tracheal deviation.  ?Cardiovascular:  ?   Rate and Rhythm: Normal rate and regular rhythm.  ?Pulmonary:  ?   Effort: Pulmonary effort is normal. No respiratory distress.  ?   Breath sounds: Normal breath sounds. No stridor.  ?Musculoskeletal:     ?  General: Normal range of motion.  ?Skin: ?   General: Skin is warm and dry.  ?   Findings: No rash.  ?Neurological:  ?   General: No focal deficit present.  ?   Mental Status: She is alert. Mental status is at baseline.  ?   Cranial Nerves: No cranial nerve deficit.  ?   Sensory: No sensory deficit.  ?   Motor: No weakness or abnormal muscle tone.  ?   Coordination: Coordination normal.  ?   Gait: Gait normal.  ?   Comments: MENTAL STATUS: AAOx3, memory intact, fund of knowledge appropriate ? ?LANG/SPEECH: Naming and repetition intact, fluent, no dysarthria, follows 3-step commands, answers questions appropriately ? ? ?CRANIAL NERVES: ?  II: Pupils equal and reactive, no RAPD ?  III, IV, VI: EOM intact, no gaze preference or deviation, no nystagmus. ?  V: normal sensation in V1, V2, and V3 segments bilaterally ?  VII: no asymmetry, no nasolabial fold flattening ?  VIII: normal hearing to speech ?  IX, X: normal palatal elevation, no uvular deviation ?  XI: 5/5 head turn and 5/5 shoulder shrug bilaterally ?  XII: midline tongue protrusion ? ? ?SENSORY:  ?Normal to light touch ?  ?Psychiatric:     ?   Mood and Affect: Mood normal.     ?   Behavior: Behavior normal.  ?  ? ?Results for orders placed or performed in visit on 01/04/22  ?CBC with Differential/Platelet  ?Result Value Ref Range  ? WBC 5.4 3.8 - 10.8 Thousand/uL  ? RBC 4.96 3.80 - 5.10 Million/uL  ? Hemoglobin 10.6 (L) 11.7 - 15.5 g/dL  ? HCT 35.7 35.0 - 45.0 %  ? MCV  72.0 (L) 80.0 - 100.0 fL  ? MCH 21.4 (L) 27.0 - 33.0 pg  ? MCHC 29.7 (L) 32.0 - 36.0 g/dL  ? RDW 16.7 (H) 11.0 - 15.0 %  ? Platelets 364 140 - 400 Thousand/uL  ? MPV 10.5 7.5 - 12.5 fL  ? Neutro Abs 2,635 1,500 - 7,800

## 2022-02-24 LAB — IRON,TIBC AND FERRITIN PANEL
%SAT: 5 % (calc) — ABNORMAL LOW (ref 16–45)
Ferritin: 24 ng/mL (ref 16–154)
Iron: 22 ug/dL — ABNORMAL LOW (ref 40–190)
TIBC: 451 mcg/dL (calc) — ABNORMAL HIGH (ref 250–450)

## 2022-02-24 LAB — CBC WITH DIFFERENTIAL/PLATELET
Absolute Monocytes: 571 cells/uL (ref 200–950)
Basophils Absolute: 72 cells/uL (ref 0–200)
Basophils Relative: 1.5 %
Eosinophils Absolute: 240 cells/uL (ref 15–500)
Eosinophils Relative: 5 %
HCT: 40.3 % (ref 35.0–45.0)
Hemoglobin: 12.5 g/dL (ref 11.7–15.5)
Lymphs Abs: 2650 cells/uL (ref 850–3900)
MCH: 23.1 pg — ABNORMAL LOW (ref 27.0–33.0)
MCHC: 31 g/dL — ABNORMAL LOW (ref 32.0–36.0)
MCV: 74.6 fL — ABNORMAL LOW (ref 80.0–100.0)
MPV: 10.1 fL (ref 7.5–12.5)
Monocytes Relative: 11.9 %
Neutro Abs: 1267 cells/uL — ABNORMAL LOW (ref 1500–7800)
Neutrophils Relative %: 26.4 %
Platelets: 290 10*3/uL (ref 140–400)
RBC: 5.4 10*6/uL — ABNORMAL HIGH (ref 3.80–5.10)
RDW: 21 % — ABNORMAL HIGH (ref 11.0–15.0)
Total Lymphocyte: 55.2 %
WBC: 4.8 10*3/uL (ref 3.8–10.8)

## 2022-02-24 LAB — COMPLETE METABOLIC PANEL WITH GFR
AG Ratio: 1.3 (calc) (ref 1.0–2.5)
ALT: 10 U/L (ref 6–29)
AST: 11 U/L (ref 10–30)
Albumin: 3.9 g/dL (ref 3.6–5.1)
Alkaline phosphatase (APISO): 65 U/L (ref 31–125)
BUN: 7 mg/dL (ref 7–25)
CO2: 24 mmol/L (ref 20–32)
Calcium: 9 mg/dL (ref 8.6–10.2)
Chloride: 104 mmol/L (ref 98–110)
Creat: 0.92 mg/dL (ref 0.50–0.96)
Globulin: 3.1 g/dL (calc) (ref 1.9–3.7)
Glucose, Bld: 77 mg/dL (ref 65–99)
Potassium: 4.6 mmol/L (ref 3.5–5.3)
Sodium: 140 mmol/L (ref 135–146)
Total Bilirubin: 0.3 mg/dL (ref 0.2–1.2)
Total Protein: 7 g/dL (ref 6.1–8.1)
eGFR: 91 mL/min/{1.73_m2} (ref >=60)

## 2022-02-24 LAB — C-REACTIVE PROTEIN: CRP: 14.1 mg/L — ABNORMAL HIGH (ref <8.0)

## 2022-02-24 LAB — SEDIMENTATION RATE: Sed Rate: 14 mm/h (ref 0–20)

## 2022-02-26 DIAGNOSIS — R519 Headache, unspecified: Secondary | ICD-10-CM | POA: Insufficient documentation

## 2022-02-26 NOTE — Assessment & Plan Note (Addendum)
Patient has increase her oral iron supplements to BID, notes that her menses have become heavier we will recheck CBC and iron panel ?She is mildly sx, gets cold easily  ?If labs are worse than recently will refer her to hematology ?

## 2022-02-26 NOTE — Assessment & Plan Note (Addendum)
Stable, well-controlled ?Continue managing allergies, continue Singulair, maintenance inhaler and follow-up if using rescue inhaler more frequently ?

## 2022-02-26 NOTE — Assessment & Plan Note (Signed)
Headache for roughly 1 year, she notes that manipulation of her scalp causes scalp tenderness and this seems to bring on headaches which are mostly left-sided, described as throbbing and sharp only improved by laying down.  ?Possible that headaches may also be related to her anemia ?Encourage patient to keep headache journal, try over-the-counter medications but less than 8 to 10 days a week, she has sumatriptan, may need eval by neurology or headache specialist ?

## 2022-03-21 ENCOUNTER — Ambulatory Visit (INDEPENDENT_AMBULATORY_CARE_PROVIDER_SITE_OTHER): Payer: Medicaid Other | Admitting: Family Medicine

## 2022-03-21 ENCOUNTER — Encounter: Payer: Self-pay | Admitting: Family Medicine

## 2022-03-21 VITALS — BP 118/72 | HR 97 | Temp 98.2°F | Resp 16 | Ht 67.0 in | Wt 169.0 lb

## 2022-03-21 DIAGNOSIS — S20219A Contusion of unspecified front wall of thorax, initial encounter: Secondary | ICD-10-CM | POA: Diagnosis not present

## 2022-03-21 DIAGNOSIS — R21 Rash and other nonspecific skin eruption: Secondary | ICD-10-CM | POA: Diagnosis not present

## 2022-03-21 DIAGNOSIS — L819 Disorder of pigmentation, unspecified: Secondary | ICD-10-CM | POA: Diagnosis not present

## 2022-03-21 NOTE — Progress Notes (Unsigned)
Patient ID: Melinda Snyder, female    DOB: 05-20-2001, 21 y.o.   MRN: [id number]  PCP: Delsa Grana, PA-C  Chief Complaint  Patient presents with   Skin Discoloration/Itchiness    B/L breast   Breast Pain    Left, hit breast at work since 02/19/22    Subjective:   Melinda Snyder is a 21 y.o. female, presents to clinic with CC of the following:  HPI  Presents with rash to chest with discoloration and itchiness, hypopigmented spots, she stopped all topical lotions/creams - was only using cerave - some spots also to face and around eyebrows  Also had contusion to chest wall, is still sore, occurred a month ago  Patient Active Problem List   Diagnosis Date Noted   Headache disorder 02/26/2022   Iron deficiency anemia due to chronic blood loss 01/04/2022   Mild persistent asthma without complication 09/12/6243   Anxiety disorder, unspecified 09/20/2021   Allergic rhinitis due to allergen 11/21/2018      Current Outpatient Medications:    albuterol (VENTOLIN HFA) 108 (90 Base) MCG/ACT inhaler, INHALE 2 PUFFS EVERY 4 HOURS AS NEEDED, Disp: , Rfl:    busPIRone (BUSPAR) 5 MG tablet, Take 1 tablet (5 mg total) by mouth 2 (two) times daily., Disp: 60 tablet, Rfl: 2   Diclofenac Sodium 3 % GEL, Apply 1 application topically 2 (two) times daily., Disp: , Rfl:    fluticasone-salmeterol (ADVAIR) 100-50 MCG/ACT AEPB, Inhale 1 puff into the lungs 2 (two) times daily., Disp: 60 each, Rfl: 11   Iron, Ferrous Sulfate, 325 (65 Fe) MG TABS, , Disp: , Rfl:    levocetirizine (XYZAL) 5 MG tablet, Take 1 tablet (5 mg total) by mouth every evening., Disp: 90 tablet, Rfl: 3   montelukast (SINGULAIR) 10 MG tablet, TAKE 1 TABLET BY MOUTH EVERYDAY AT BEDTIME, Disp: 90 tablet, Rfl: 3   nystatin (MYCOSTATIN/NYSTOP) powder, Apply 1 application topically 3 (three) times daily., Disp: 15 g, Rfl: 0   nystatin cream (MYCOSTATIN), Apply 1 application topically 2 (two) times daily., Disp: 30 g, Rfl: 0    sertraline (ZOLOFT) 50 MG tablet, Take 1 tablet (50 mg total) by mouth daily., Disp: 90 tablet, Rfl: 3   SUMAtriptan (IMITREX) 50 MG tablet, May repeat in 2 hours if headache persists or recurs., Disp: 10 tablet, Rfl: 0   TRI-LO-MARZIA 0.18/0.215/0.25 MG-25 MCG tab, TAKE 1 TABLET BY MOUTH EVERY DAY, Disp: 84 tablet, Rfl: 1   Allergies  Allergen Reactions   Grass Extracts [Gramineae Pollens]    Tree Extract      Social History   Tobacco Use   Smoking status: Never   Smokeless tobacco: Never  Vaping Use   Vaping Use: Never used  Substance Use Topics   Alcohol use: No   Drug use: No      Chart Review Today: I personally reviewed active problem list, medication list, allergies, family history, social history, health maintenance, notes from last encounter, lab results, imaging with the patient/caregiver today.   Review of Systems  Constitutional: Negative.   HENT: Negative.    Eyes: Negative.   Respiratory: Negative.  Negative for chest tightness, shortness of breath and wheezing.   Cardiovascular: Negative.  Negative for palpitations and leg swelling.  Gastrointestinal: Negative.   Endocrine: Negative.   Genitourinary: Negative.   Musculoskeletal: Negative.   Skin: Negative.   Allergic/Immunologic: Negative.   Neurological: Negative.   Hematological: Negative.   Psychiatric/Behavioral: Negative.    All other  systems reviewed and are negative.     Objective:   Vitals:   03/21/22 1423  BP: 118/72  Pulse: 97  Resp: 16  Temp: 98.2 F (36.8 C)  SpO2: 98%  Weight: 169 lb (76.7 kg)  Height: 5' 7"  (1.702 m)    Body mass index is 26.47 kg/m.  Physical Exam Vitals and nursing note reviewed.  Constitutional:      General: She is not in acute distress.    Appearance: Normal appearance. She is well-developed. She is not ill-appearing, toxic-appearing or diaphoretic.  HENT:     Head: Normocephalic and atraumatic.     Nose: Nose normal.  Eyes:     General:         Right eye: No discharge.        Left eye: No discharge.     Conjunctiva/sclera: Conjunctivae normal.  Neck:     Trachea: No tracheal deviation.  Cardiovascular:     Rate and Rhythm: Normal rate and regular rhythm.     Pulses: Normal pulses.     Heart sounds: Normal heart sounds.  Pulmonary:     Effort: Pulmonary effort is normal. No respiratory distress.     Breath sounds: Normal breath sounds. No stridor. No wheezing, rhonchi or rales.  Chest:     Chest wall: Tenderness present.  Musculoskeletal:        General: Normal range of motion.  Skin:    General: Skin is warm and dry.     Findings: Rash (hypopigmented flat scattered rash to upper chest and face) present.  Neurological:     Mental Status: She is alert.     Motor: No abnormal muscle tone.     Coordination: Coordination normal.  Psychiatric:        Mood and Affect: Mood normal.        Behavior: Behavior normal.     Results for orders placed or performed in visit on 02/23/22  CBC with Differential/Platelet  Result Value Ref Range   WBC 4.8 3.8 - 10.8 Thousand/uL   RBC 5.40 (H) 3.80 - 5.10 Million/uL   Hemoglobin 12.5 11.7 - 15.5 g/dL   HCT 40.3 35.0 - 45.0 %   MCV 74.6 (L) 80.0 - 100.0 fL   MCH 23.1 (L) 27.0 - 33.0 pg   MCHC 31.0 (L) 32.0 - 36.0 g/dL   RDW 21.0 (H) 11.0 - 15.0 %   Platelets 290 140 - 400 Thousand/uL   MPV 10.1 7.5 - 12.5 fL   Neutro Abs 1,267 (L) 1,500 - 7,800 cells/uL   Lymphs Abs 2,650 850 - 3,900 cells/uL   Absolute Monocytes 571 200 - 950 cells/uL   Eosinophils Absolute 240 15 - 500 cells/uL   Basophils Absolute 72 0 - 200 cells/uL   Neutrophils Relative % 26.4 %   Total Lymphocyte 55.2 %   Monocytes Relative 11.9 %   Eosinophils Relative 5.0 %   Basophils Relative 1.5 %  Iron, TIBC and Ferritin Panel  Result Value Ref Range   Iron 22 (L) 40 - 190 mcg/dL   TIBC 451 (H) 250 - 450 mcg/dL (calc)   %SAT 5 (L) 16 - 45 % (calc)   Ferritin 24 16 - 154 ng/mL  COMPLETE METABOLIC PANEL WITH GFR   Result Value Ref Range   Glucose, Bld 77 65 - 99 mg/dL   BUN 7 7 - 25 mg/dL   Creat 0.92 0.50 - 0.96 mg/dL   eGFR 91 > OR = 60 mL/min/1.67m  BUN/Creatinine Ratio NOT APPLICABLE 6 - 22 (calc)   Sodium 140 135 - 146 mmol/L   Potassium 4.6 3.5 - 5.3 mmol/L   Chloride 104 98 - 110 mmol/L   CO2 24 20 - 32 mmol/L   Calcium 9.0 8.6 - 10.2 mg/dL   Total Protein 7.0 6.1 - 8.1 g/dL   Albumin 3.9 3.6 - 5.1 g/dL   Globulin 3.1 1.9 - 3.7 g/dL (calc)   AG Ratio 1.3 1.0 - 2.5 (calc)   Total Bilirubin 0.3 0.2 - 1.2 mg/dL   Alkaline phosphatase (APISO) 65 31 - 125 U/L   AST 11 10 - 30 U/L   ALT 10 6 - 29 U/L  Sedimentation rate  Result Value Ref Range   Sed Rate 14 0 - 20 mm/h  C-reactive protein  Result Value Ref Range   CRP 14.1 (H) <8.0 mg/L       Assessment & Plan:   1. Skin hypopigmentation Suspect it may be tinea versicolor - gave handout of topical OTC meds to try, she wants derm referral Encouraged hydration and sunblock/avoiding sun exposure Can use OTC antihistamine like zyrtec or xyzal for itchiness - Ambulatory referral to Dermatology  2. Rash and nonspecific skin eruption See above - Ambulatory referral to Dermatology  3. Contusion of chest wall, unspecified laterality, initial encounter Some mild anterior chest wall ttp, lung sounds normal - can try ice or NSAIDs, expect it to gradually improve No deformity, swelling, bruising      Delsa Grana, PA-C 03/21/22 3:00 PM

## 2022-03-21 NOTE — Patient Instructions (Signed)
Tinea Versicolor  Tinea versicolor is a skin infection. It is caused by a type of yeast. It is normal for some yeast to be on your skin, but too much yeast causes this infection. The infection causes a rash of light or dark patches on your skin. The rash is most common on the chest, back, neck, or upper arms. The infection usually does not cause other problems. If it is treated, it will probably go away in a few weeks. The infection cannot be spread from one person to another (is notcontagious). What are the causes? This condition is caused by a certain type of yeast that starts to grow too much on your skin. What increases the risk? Heat and humidity. Sweating too much. Hormone changes. This may happen when taking birth control pills. Oily skin. A weak disease-fighting system (immunesystem). What are the signs or symptoms? A rash of light or dark patches on your skin. The rash may have: Patches of tan or pink spots (on light skin). Patches of Bonine or brown spots (on dark skin). Patches of skin that do not tan. Well-marked edges. Scales. Mild itching. There may also be no itching. How is this treated? Treatment for this condition may include: Dandruff shampoo. The shampoo may be used on the affected skin during showers or baths. Over-the-counter medicated skin cream, lotion, or soaps. Prescription antifungal medicine. This may include cream or pills. Medicine to help your itching. Follow these instructions at home: Use over-the-counter and prescription medicines only as told by your doctor. Wash your skin with dandruff shampoo as told by your doctor. Do not scratch your skin in the rash area. Avoid places that are hot and humid. Do not use tanning booths. Try to avoid sweating a lot. Contact a doctor if: Your symptoms get worse. You have a fever. You have signs of infection such as: Redness, swelling, or pain in the rash area. Warmth coming from your rash. Fluid or blood  coming from your rash. Pus or a bad smell coming from your rash. Your rash comes back (recurs) after treatment. Your rash does not improve with treatment. Your rash spreads to other parts of the body. Summary Tinea versicolor is a skin infection. It causes a rash of light or dark patches on your skin. The rash is most common on the chest, back, neck, or upper arms. This infection usually does not cause other problems. Use over-the-counter and prescription medicines only as told by your doctor. If the infection is treated, it will probably go away in a few weeks. This information is not intended to replace advice given to you by your health care provider. Make sure you discuss any questions you have with your health care provider. Document Revised: 12/27/2020 Document Reviewed: 12/27/2020 [employer] Patient Education  2023 [employer]  

## 2022-03-24 ENCOUNTER — Other Ambulatory Visit: Payer: Self-pay | Admitting: Family Medicine

## 2022-03-24 DIAGNOSIS — Z3041 Encounter for surveillance of contraceptive pills: Secondary | ICD-10-CM

## 2022-05-10 DIAGNOSIS — L700 Acne vulgaris: Secondary | ICD-10-CM | POA: Diagnosis not present

## 2022-05-10 DIAGNOSIS — L218 Other seborrheic dermatitis: Secondary | ICD-10-CM | POA: Diagnosis not present

## 2022-05-10 DIAGNOSIS — L818 Other specified disorders of pigmentation: Secondary | ICD-10-CM | POA: Diagnosis not present

## 2022-05-10 DIAGNOSIS — L816 Other disorders of diminished melanin formation: Secondary | ICD-10-CM | POA: Diagnosis not present

## 2022-05-14 DIAGNOSIS — S46811A Strain of other muscles, fascia and tendons at shoulder and upper arm level, right arm, initial encounter: Secondary | ICD-10-CM | POA: Diagnosis not present

## 2022-06-07 ENCOUNTER — Encounter: Payer: Medicaid Other | Admitting: Family Medicine

## 2022-06-07 NOTE — Progress Notes (Signed)
Patient: Melinda Snyder, Female    DOB: May 04, 2001, 21 y.o.   MRN: [id number] Delsa Grana, PA-C Visit Date: 06/08/2022  Today's Provider: Delsa Grana, PA-C   Chief Complaint  Patient presents with   Annual Exam    Pt unsure if wants pap smear due to not being sexually active yet. Pt prefers to discuss with PCP   Subjective:   Annual physical exam:  Melinda Snyder is a 21 y.o. female who presents today for complete physical exam:  Exercise/ActivityDiet/nutrition:  diet and exercise fluctuating with med changes  Sleep: sleeping ok, no concerns  SDOH Screenings   Alcohol Screen: Low Risk  (06/08/2022)   Alcohol Screen    Last Alcohol Screening Score (AUDIT): 0  Depression (PHQ2-9): Low Risk  (06/08/2022)   Depression (PHQ2-9)    PHQ-2 Score: 0  Financial Resource Strain: Low Risk  (06/08/2022)   Overall Financial Resource Strain (CARDIA)    Difficulty of Paying Living Expenses: Not hard at all  Food Insecurity: No Food Insecurity (06/08/2022)   Hunger Vital Sign    Worried About Running Out of Food in the Last Year: Never true    Ran Out of Food in the Last Year: Never true  Housing: Low Risk  (06/08/2022)   Housing    Last Housing Risk Score: 0  Physical Activity: Sufficiently Active (06/08/2022)   Exercise Vital Sign    Days of Exercise per Week: 5 days    Minutes of Exercise per Session: 150+ min  Social Connections: Moderately Integrated (06/08/2022)   Social Connection and Isolation Panel [NHANES]    Frequency of Communication with Friends and Family: More than three times a week    Frequency of Social Gatherings with Friends and Family: More than three times a week    Attends Religious Services: 1 to 4 times per year    Active Member of Clubs or Organizations: Yes    Attends Archivist Meetings: More than 4 times per year    Marital Status: Never married  Stress: No Stress Concern Present (06/08/2022)   [employer] [place]    Feeling of Stress : Not at all  Tobacco Use: Low Risk  (06/08/2022)   Patient History    Smoking Tobacco Use: Never    Smokeless Tobacco Use: Never    Passive Exposure: Not on file  Transportation Needs: No Transportation Needs (06/08/2022)   PRAPARE - Transportation    Lack of Transportation (Medical): No    Lack of Transportation (Non-Medical): No     USPSTF grade A and B recommendations - reviewed and addressed today  Depression:  Phq 9 completed today by patient, was reviewed by me with patient in the room PHQ score is neg, pt feels good    06/08/2022   11:15 AM 03/21/2022    2:23 PM 02/23/2022   11:28 AM 01/04/2022    8:44 AM  PHQ 2/9 Scores  PHQ - 2 Score 0 1 1 1   PHQ- 9 Score 0  1 11      06/08/2022   11:15 AM 03/21/2022    2:23 PM 02/23/2022   11:28 AM 01/04/2022    8:44 AM 11/22/2021    2:12 PM  Depression screen PHQ 2/9  Decreased Interest 0 1 0 1 0  Down, Depressed, Hopeless 0 0 1 0 0  PHQ - 2 Score 0 1 1 1  0  Altered sleeping 0  0 2 0  Tired, decreased energy 0  0 1 0  Change in appetite 0  0 2 1  Feeling bad or failure about yourself  0  0 0 0  Trouble concentrating 0  0 2 0  Moving slowly or fidgety/restless 0  0 3 0  Suicidal thoughts 0  0 0 0  PHQ-9 Score 0  1 11 1   Difficult doing work/chores Not difficult at all  Not difficult at all Somewhat difficult     Alcohol screening: [place] Office Visit [facility]08/[facility]  AUDIT-C Score 0       Immunizations and Health Maintenance: Health Maintenance  Topic Date Due   Hepatitis C Screening  Never done   PAP SMEAR-Modifier  06/08/2022 (Originally 02/11/2022)   INFLUENZA VACCINE  07/23/2022 (Originally 05/22/2022)   PAP-Cervical Cytology Screening  06/09/2023 (Originally 02/11/2022)   TETANUS/TDAP  05/24/2031   HIV Screening  Completed   HPV VACCINES  Discontinued   COVID-19 Vaccine  Discontinued     Hep C Screening: due  STD  testing and prevention (HIV/chl/gon/syphilis):  see above, no additional testing desired by pt today STD   Intimate partner violence: feels safe at home - lives with mom, twin sisters just left to college, come and go, father passed away 5 years ago   Sexual History/Pain during Intercourse: Single  Menstrual History/LMP/Abnormal Bleeding: regular with OCP Patient's last menstrual period was 05/22/2022 (approximate).  Incontinence Symptoms:  none  Breast cancer:  not needed Last Mammogram: *see HM list above BRCA gene screening:    Cervical cancer screening:  due but pt declines - discussed screening recommendations Pt no family hx of cancers - breast, ovarian, uterine, colon:     Osteoporosis:   Discussion on osteoporosis per age, including high calcium and vitamin D supplementation, weight bearing exercises  Skin cancer:  Hx of skin CA -  NO Discussed atypical lesions   Colorectal cancer:   Colonoscopy is not indicated for age  Discussed concerning signs and sx of CRC, pt denies blood in stool - tested and neg previously  Lung cancer:   Low Dose CT Chest recommended if Age 79-80 years, 20 pack-year currently smoking OR have quit w/in 15years. Patient does not qualify.    Social History   Tobacco Use   Smoking status: Never   Smokeless tobacco: Never  Vaping Use   Vaping Use: Never used  Substance Use Topics   Alcohol use: No   Drug use: No     Flowsheet Row [facility] Visit [facility]/08/20[facility] in [facility]  AUDIT-C Score 0       Family History  Problem Relation Age of Onset   Hypertension Mother    Iron deficiency Mother    GER disease Mother    Heart disease Father    Kidney disease Father      Blood pressure/Hypertension: BP Readings from Last 3 Encounters:  06/08/22 106/68  03/21/22 118/72  02/23/22 116/72    Weight/Obesity: Wt Readings from Last 3 Encounters:  06/08/22 173 lb 4.8 oz (78.6 kg)  03/21/22 169 lb (76.7 kg)  02/23/22  167 lb 1.6 oz (75.8 kg)   BMI Readings from Last 3 Encounters:  06/08/22 27.35 kg/m  03/21/22 26.47 kg/m  02/23/22 26.17 kg/m     Lipids:  No results found for: "CHOL" No results found for: "HDL" No results found for: "[place]" No results found for: "TRIG" No results found for: "CHOLHDL" No results found for: "LDLDIRECT"  Based on the results of lipid panel his/her cardiovascular risk factor ( using [facility] )  in the next 10 years is: The ASCVD Risk score (Arnett DK, et al., 2019) failed to calculate for the following reasons:   The 2019 ASCVD risk score is only valid for ages 49 to 23  Glucose:  Glucose, Bld  Date Value Ref Range Status  02/23/2022 77 65 - 99 mg/dL Final    Comment:    .            Fasting reference interval .   05/23/2021 74 65 - 99 mg/dL Final    Comment:    .            Fasting reference interval .   12/10/2020 92 70 - 99 mg/dL Final    Comment:    Glucose reference range applies only to samples taken after fasting for at least 8 hours.   Glucose-Capillary  Date Value Ref Range Status  04/28/2016 74 65 - 99 mg/dL Final    Advanced Care Planning:  A voluntary discussion about advance care planning including the explanation and discussion of advance directives.   Discussed health care proxy and Living will, and the patient was able to identify a health care proxy as mother.   Patient does not have a living will at present time.   Social History       Social History   Socioeconomic History   Marital status: Single    Spouse name: Not on file   Number of children: Not on file   Years of education: 12   Highest education level: Not on file  Occupational History   Occupation: [employer]    Comment: full time  Tobacco Use   Smoking status: Never   Smokeless tobacco: Never  Vaping Use   Vaping Use: Never used  Substance and Sexual Activity   Alcohol use: No   Drug use: No   Sexual activity: Not Currently     Partners: Male    Birth control/protection: Pill    Comment: in the past 2 years, one partner  Other Topics Concern   Not on file  Social History Narrative   Did some colllege - 1 year   Working fulltime Tourist information centre manager   Social Determinants of Health   Financial Resource Strain: [place]  (06/08/2022)   Overall Financial Resource Strain (CARDIA)    Difficulty of Paying Living Expenses: Not hard at all  Food Insecurity: No Food Insecurity (06/08/2022)   Hunger Vital Sign    Worried About Running Out of Food in the Last Year: Never true    [place] in the Last Year: Never true  Transportation Needs: No Transportation Needs (06/08/2022)   PRAPARE - Hydrologist (Medical): No    Lack of Transportation (Non-Medical): No  Physical Activity: Sufficiently Active (06/08/2022)   Exercise Vital Sign    Days of Exercise per Week: 5 days    Minutes of Exercise per Session: 150+ min  Stress: No Stress Concern Present (06/08/2022)   [place]    Feeling of Stress : Not at all  Social Connections: Moderately Integrated (06/08/2022)   Social Connection and Isolation Panel [NHANES]    Frequency of Communication with Friends and Family: More than three times a week    Frequency of Social Gatherings with Friends and Family: More than three times a week  Attends Religious Services: 1 to 4 times per year    Active Member of Clubs or Organizations: Yes    Attends Music therapist: More than 4 times per year    Marital Status: Never married    Family History        Family History  Problem Relation Age of Onset   Hypertension Mother    Iron deficiency Mother    GER disease Mother    Heart disease Father    Kidney disease Father     Patient Active Problem List   Diagnosis Date Noted   Headache disorder 02/26/2022   Iron deficiency anemia due to chronic blood loss 01/04/2022   Mild  persistent asthma without complication 98/33/8250   Anxiety disorder, unspecified 09/20/2021   Allergic rhinitis due to allergen 11/21/2018    Past Surgical History:  Procedure Laterality Date   NO PAST SURGERIES       Current Outpatient Medications:    albuterol (VENTOLIN HFA) 108 (90 Base) MCG/ACT inhaler, INHALE 2 PUFFS EVERY 4 HOURS AS NEEDED, Disp: , Rfl:    busPIRone (BUSPAR) 5 MG tablet, Take 1 tablet (5 mg total) by mouth 2 (two) times daily., Disp: 60 tablet, Rfl: 2   Clindamycin-Benzoyl Per, Refr, gel, SMARTSIG:sparingly Topical Daily, Disp: , Rfl:    Diclofenac Sodium 3 % GEL, Apply 1 application topically 2 (two) times daily., Disp: , Rfl:    fluticasone-salmeterol (ADVAIR) 100-50 MCG/ACT AEPB, Inhale 1 puff into the lungs 2 (two) times daily., Disp: 60 each, Rfl: 11   hydrocortisone 2.5 % cream, SMARTSIG:sparingly Topical Twice Daily, Disp: , Rfl:    Iron, Ferrous Sulfate, 325 (65 Fe) MG TABS, , Disp: , Rfl:    ketoconazole (NIZORAL) 2 % shampoo, SMARTSIG:Topical 2-3 Times Weekly, Disp: , Rfl:    montelukast (SINGULAIR) 10 MG tablet, TAKE 1 TABLET BY MOUTH EVERYDAY AT BEDTIME, Disp: 90 tablet, Rfl: 3   sertraline (ZOLOFT) 50 MG tablet, Take 1 tablet (50 mg total) by mouth daily., Disp: 90 tablet, Rfl: 3   SUMAtriptan (IMITREX) 50 MG tablet, May repeat in 2 hours if headache persists or recurs., Disp: 10 tablet, Rfl: 0   TRI-LO-MARZIA 0.18/0.215/0.25 MG-25 MCG tab, TAKE 1 TABLET BY MOUTH EVERY DAY, Disp: 84 tablet, Rfl: 1   levocetirizine (XYZAL) 5 MG tablet, Take 1 tablet (5 mg total) by mouth every evening., Disp: 90 tablet, Rfl: 3   nystatin (MYCOSTATIN/NYSTOP) powder, Apply 1 application topically 3 (three) times daily. (Patient not taking: Reported on 06/08/2022), Disp: 15 g, Rfl: 0  Allergies  Allergen Reactions   Grass Extracts [Gramineae Pollens]    Tree Extract     Patient Care Team: Delsa Grana, PA-C as PCP - General (Family Medicine)   Chart Review: I  personally reviewed active problem list, medication list, allergies, family history, social history, health maintenance, notes from last encounter, lab results, imaging with the patient/caregiver today.   Review of Systems  Constitutional: Negative.   HENT: Negative.    Eyes: Negative.   Respiratory: Negative.    Cardiovascular: Negative.   Gastrointestinal: Negative.   Endocrine: Negative.   Genitourinary: Negative.   Musculoskeletal: Negative.   Skin: Negative.   Allergic/Immunologic: Negative.   Neurological: Negative.   Hematological: Negative.   Psychiatric/Behavioral: Negative.    All other systems reviewed and are negative.         Objective:   Vitals:  Vitals:   06/08/22 1119  BP: 106/68  Pulse: 88  Resp: 16  Temp: 98.2 F (36.8 C)  TempSrc: Oral  SpO2: 99%  Weight: 173 lb 4.8 oz (78.6 kg)  Height: 5' 6.75" (1.695 m)    Body mass index is 27.35 kg/m.  Physical Exam Vitals and nursing note reviewed.  Constitutional:      General: She is not in acute distress.    Appearance: Normal appearance. She is well-developed and overweight. She is not ill-appearing, toxic-appearing or diaphoretic.  HENT:     Head: Normocephalic and atraumatic.     Right Ear: External ear normal.     Left Ear: External ear normal.  Eyes:     General: Lids are normal. No scleral icterus.       Right eye: No discharge.        Left eye: No discharge.     Conjunctiva/sclera: Conjunctivae normal.  Neck:     Trachea: Phonation normal. No tracheal deviation.  Cardiovascular:     Rate and Rhythm: Normal rate and regular rhythm.     Pulses: Normal pulses.          Radial pulses are 2+ on the right side and 2+ on the left side.       Posterior tibial pulses are 2+ on the right side and 2+ on the left side.     Heart sounds: Normal heart sounds. No murmur heard.    No friction rub. No gallop.  Pulmonary:     Effort: Pulmonary effort is normal. No respiratory distress.     Breath  sounds: Normal breath sounds. No stridor. No wheezing, rhonchi or rales.  Chest:     Chest wall: No tenderness.  Abdominal:     General: Bowel sounds are normal. There is no distension.     Palpations: Abdomen is soft.  Musculoskeletal:     Right lower leg: No edema.     Left lower leg: No edema.  Skin:    General: Skin is warm and dry.     Coloration: Skin is not jaundiced or pale.     Findings: No rash.  Neurological:     Mental Status: She is alert. Mental status is at baseline.     Motor: No abnormal muscle tone.     Gait: Gait normal.  Psychiatric:        Mood and Affect: Mood normal.        Speech: Speech normal.        Behavior: Behavior normal. Behavior is cooperative.       Fall Risk:    06/08/2022   11:14 AM 03/21/2022    2:23 PM 02/23/2022   11:24 AM 01/04/2022    8:44 AM 11/22/2021    2:11 PM  Fall Risk   Falls in the past year? 0 0 0 0 0  Number falls in past yr: 0 0 0 0 0  Injury with Fall? 0 0 0 0 0  Risk for fall due to : No Fall Risks No Fall Risks  No Fall Risks No Fall Risks  Follow up Education provided;Falls prevention discussed Falls prevention discussed  Falls prevention discussed Falls prevention discussed    Functional Status Survey: Is the patient deaf or have difficulty hearing?: No Does the patient have difficulty seeing, even when wearing glasses/contacts?: No Does the patient have difficulty concentrating, remembering, or making decisions?: No Does the patient have difficulty walking or climbing stairs?: No Does the patient have difficulty dressing or bathing?: No Does the patient have difficulty doing errands alone such as visiting  a doctor's office or shopping?: No   Assessment & Plan:    CPE completed today  USPSTF grade A and B recommendations reviewed with patient; age-appropriate recommendations, preventive care, screening tests, etc discussed and encouraged; healthy living encouraged; see AVS for patient education given to  patient  Discussed importance of 150 minutes of physical activity weekly, AHA exercise recommendations given to pt in AVS/handout  Discussed importance of healthy diet:  eating lean meats and proteins, avoiding trans fats and saturated fats, avoid simple sugars and excessive carbs in diet, eat 6 servings of fruit/vegetables daily and drink plenty of water and avoid sweet beverages.    Recommended pt to do annual eye exam and routine dental exams/cleanings  Depression, alcohol, fall screening completed as documented above and per flowsheets  Advance Care planning information and packet discussed and offered today, encouraged pt to discuss with family members/spouse/partner/friends and complete Advanced directive packet and bring copy to office   Reviewed Health Maintenance: Health Maintenance  Topic Date Due   Hepatitis C Screening  Never done   PAP SMEAR-Modifier  06/08/2022 (Originally 02/11/2022)   INFLUENZA VACCINE  07/23/2022 (Originally 05/22/2022)   PAP-Cervical Cytology Screening  06/09/2023 (Originally 02/11/2022)   TETANUS/TDAP  05/24/2031   HIV Screening  Completed   HPV VACCINES  Discontinued   COVID-19 Vaccine  Discontinued    Immunizations: Immunization History  Administered Date(s) Administered   HPV 9-valent 05/23/2021, 07/03/2021   Influenza,inj,Quad PF,6+ Mos 06/24/2019, 07/03/2021   Moderna Sars-Covid-2 Vaccination 03/17/2020, 04/14/2020   Tdap 05/23/2021   Vaccines:  HPV: up to at age 89 , ask insurance if age between 23-45  Shingrix: 33-64 yo and ask insurance if covered when patient above 21 yo Pneumonia: advised she should have- educated and discussed with patient. Flu: encouraged to get- educated and discussed with patient.     ICD-10-CM   1. Annual physical exam  Z00.00 CBC with Differential/Platelet    COMPLETE METABOLIC PANEL WITH GFR    Lipid panel    Hepatitis C antibody    2. Screening for malignant neoplasm of cervix  Z12.4    declines today,  not sexually active    3. Encounter for hepatitis C screening test for low risk patient  Z11.59 Hepatitis C antibody    4. Need for influenza vaccination  Z23     5. Seasonal allergic rhinitis, unspecified trigger  J30.2 levocetirizine (XYZAL) 5 MG tablet   well controlled with current management - xyzal and singular    6. Mild persistent asthma without complication  V67.01 levocetirizine (XYZAL) 5 MG tablet          Delsa Grana, PA-C 06/08/22 12:03 PM  [facility]

## 2022-06-07 NOTE — Patient Instructions (Signed)
Preventive Care 50-21 Years Old, Female Preventive care refers to lifestyle choices and visits with your health care provider that can promote health and wellness. At this stage in your life, you may start seeing a primary care physician instead of a pediatrician for your preventive care. Preventive care visits are also called wellness exams. What can I expect for my preventive care visit? Counseling During your preventive care visit, your health care provider may ask about your: Medical history, including: Past medical problems. Family medical history. Pregnancy history. Current health, including: Menstrual cycle. Method of birth control. Emotional well-being. Home life and relationship well-being. Sexual activity and sexual health. Lifestyle, including: Alcohol, nicotine or tobacco, and drug use. Access to firearms. Diet, exercise, and sleep habits. Sunscreen use. Motor vehicle safety. Physical exam Your health care provider may check your: Height and weight. These may be used to calculate your BMI (body mass index). BMI is a measurement that tells if you are at a healthy weight. Waist circumference. This measures the distance around your waistline. This measurement also tells if you are at a healthy weight and may help predict your risk of certain diseases, such as type 2 diabetes and high blood pressure. Heart rate and blood pressure. Body temperature. Skin for abnormal spots. Breasts. What immunizations do I need?  Vaccines are usually given at various ages, according to a schedule. Your health care provider will recommend vaccines for you based on your age, medical history, and lifestyle or other factors, such as travel or where you work. What tests do I need? Screening Your health care provider may recommend screening tests for certain conditions. This may include: Vision and hearing tests. Lipid and cholesterol levels. Pelvic exam and Pap test. Hepatitis B  test. Hepatitis C test. HIV (human immunodeficiency virus) test. STI (sexually transmitted infection) testing, if you are at risk. Tuberculosis skin test if you have symptoms. BRCA-related cancer screening. This may be done if you have a family history of breast, ovarian, tubal, or peritoneal cancers. Talk with your health care provider about your test results, treatment options, and if necessary, the need for more tests. Follow these instructions at home: Eating and drinking  Eat a healthy diet that includes fresh fruits and vegetables, whole grains, lean protein, and low-fat dairy products. Drink enough fluid to keep your urine pale yellow. Do not drink alcohol if: Your health care provider tells you not to drink. You are pregnant, may be pregnant, or are planning to become pregnant. You are under the legal drinking age. In the [place] the legal drinking age is 21. If you drink alcohol: Limit how much you have to 0-1 drink a day. Know how much alcohol is in your drink. In the [place] one drink equals one 12 oz bottle of beer (355 mL), one 5 oz glass of wine (148 mL), or one 1 oz glass of hard liquor (44 mL). Lifestyle Brush your teeth every morning and night with fluoride toothpaste. Floss one time each day. Exercise for at least 30 minutes 5 or more days of the week. Do not use any products that contain nicotine or tobacco. These products include cigarettes, chewing tobacco, and vaping devices, such as e-cigarettes. If you need help quitting, ask your health care provider. Do not use drugs. If you are sexually active, practice safe sex. Use a condom or other form of protection to prevent STIs. If you do not wish to become pregnant, use a form of birth control. If you plan to become pregnant,  see your health care provider for a prepregnancy visit. Find healthy ways to manage stress, such as: Meditation, yoga, or listening to music. Journaling. Talking to a trusted person. Spending time  with friends and family. Safety Always wear your seat belt while driving or riding in a vehicle. Do not drive: If you have been drinking alcohol. Do not ride with someone who has been drinking. When you are tired or distracted. While texting. If you have been using any mind-altering substances or drugs. Wear a helmet and other protective equipment during sports activities. If you have firearms in your house, make sure you follow all gun safety procedures. Seek help if you have been bullied, physically abused, or sexually abused. Use the internet responsibly to avoid dangers, such as online bullying and online sex predators. What's next? Go to your health care provider once a year for an annual wellness visit. Ask your health care provider how often you should have your eyes and teeth checked. Stay up to date on all vaccines. This information is not intended to replace advice given to you by your health care provider. Make sure you discuss any questions you have with your health care provider. Document Revised: 04/05/2021 Document Reviewed: 04/05/2021 Elsevier Patient Education  2023 [employer]  

## 2022-06-08 ENCOUNTER — Encounter: Payer: Self-pay | Admitting: Family Medicine

## 2022-06-08 ENCOUNTER — Other Ambulatory Visit: Payer: Self-pay | Admitting: Family Medicine

## 2022-06-08 ENCOUNTER — Ambulatory Visit (INDEPENDENT_AMBULATORY_CARE_PROVIDER_SITE_OTHER): Payer: Medicaid Other | Admitting: Family Medicine

## 2022-06-08 VITALS — BP 106/68 | HR 88 | Temp 98.2°F | Resp 16 | Ht 66.75 in | Wt 173.3 lb

## 2022-06-08 DIAGNOSIS — J4530 Mild persistent asthma, uncomplicated: Secondary | ICD-10-CM | POA: Diagnosis not present

## 2022-06-08 DIAGNOSIS — Z23 Encounter for immunization: Secondary | ICD-10-CM

## 2022-06-08 DIAGNOSIS — J309 Allergic rhinitis, unspecified: Secondary | ICD-10-CM

## 2022-06-08 DIAGNOSIS — J302 Other seasonal allergic rhinitis: Secondary | ICD-10-CM

## 2022-06-08 DIAGNOSIS — Z0000 Encounter for general adult medical examination without abnormal findings: Secondary | ICD-10-CM | POA: Diagnosis not present

## 2022-06-08 DIAGNOSIS — Z1159 Encounter for screening for other viral diseases: Secondary | ICD-10-CM | POA: Diagnosis not present

## 2022-06-08 DIAGNOSIS — Z124 Encounter for screening for malignant neoplasm of cervix: Secondary | ICD-10-CM

## 2022-06-08 DIAGNOSIS — Z113 Encounter for screening for infections with a predominantly sexual mode of transmission: Secondary | ICD-10-CM

## 2022-06-08 MED ORDER — LEVOCETIRIZINE DIHYDROCHLORIDE 5 MG PO TABS: 5.0000 mg | ORAL_TABLET | Freq: Every evening | ORAL | 3 refills | Status: DC

## 2022-06-09 ENCOUNTER — Other Ambulatory Visit: Payer: Self-pay | Admitting: Family Medicine

## 2022-06-09 DIAGNOSIS — F419 Anxiety disorder, unspecified: Secondary | ICD-10-CM

## 2022-06-11 ENCOUNTER — Ambulatory Visit (INDEPENDENT_AMBULATORY_CARE_PROVIDER_SITE_OTHER): Payer: Medicaid Other | Admitting: Family Medicine

## 2022-06-11 ENCOUNTER — Encounter: Payer: Self-pay | Admitting: Family Medicine

## 2022-06-11 VITALS — BP 104/68 | HR 85 | Temp 98.3°F | Resp 16 | Ht 66.5 in | Wt 171.2 lb

## 2022-06-11 DIAGNOSIS — L299 Pruritus, unspecified: Secondary | ICD-10-CM

## 2022-06-11 DIAGNOSIS — M79601 Pain in right arm: Secondary | ICD-10-CM | POA: Diagnosis not present

## 2022-06-11 DIAGNOSIS — M542 Cervicalgia: Secondary | ICD-10-CM

## 2022-06-11 DIAGNOSIS — R21 Rash and other nonspecific skin eruption: Secondary | ICD-10-CM

## 2022-06-11 LAB — COMPLETE METABOLIC PANEL WITH GFR
AG Ratio: 1.7 (calc) (ref 1.0–2.5)
ALT: 13 U/L (ref 6–29)
AST: 17 U/L (ref 10–30)
Albumin: 4.3 g/dL (ref 3.6–5.1)
Alkaline phosphatase (APISO): 76 U/L (ref 31–125)
BUN: 10 mg/dL (ref 7–25)
CO2: 26 mmol/L (ref 20–32)
Calcium: 9.5 mg/dL (ref 8.6–10.2)
Chloride: 105 mmol/L (ref 98–110)
Creat: 0.87 mg/dL (ref 0.50–0.96)
Globulin: 2.6 g/dL (calc) (ref 1.9–3.7)
Glucose, Bld: 84 mg/dL (ref 65–99)
Potassium: 4 mmol/L (ref 3.5–5.3)
Sodium: 140 mmol/L (ref 135–146)
Total Bilirubin: 0.4 mg/dL (ref 0.2–1.2)
Total Protein: 6.9 g/dL (ref 6.1–8.1)
eGFR: 97 mL/min/{1.73_m2} (ref >=60)

## 2022-06-11 LAB — CBC WITH DIFFERENTIAL/PLATELET
Absolute Monocytes: 600 cells/uL (ref 200–950)
Basophils Absolute: 53 cells/uL (ref 0–200)
Basophils Relative: 0.7 %
Eosinophils Absolute: 190 cells/uL (ref 15–500)
Eosinophils Relative: 2.5 %
HCT: 37.6 % (ref 35.0–45.0)
Hemoglobin: 11.7 g/dL (ref 11.7–15.5)
Lymphs Abs: 3420 cells/uL (ref 850–3900)
MCH: 24.1 pg — ABNORMAL LOW (ref 27.0–33.0)
MCHC: 31.1 g/dL — ABNORMAL LOW (ref 32.0–36.0)
MCV: 77.4 fL — ABNORMAL LOW (ref 80.0–100.0)
MPV: 9.9 fL (ref 7.5–12.5)
Monocytes Relative: 7.9 %
Neutro Abs: 3336 cells/uL (ref 1500–7800)
Neutrophils Relative %: 43.9 %
Platelets: 287 10*3/uL (ref 140–400)
RBC: 4.86 10*6/uL (ref 3.80–5.10)
RDW: 15.6 % — ABNORMAL HIGH (ref 11.0–15.0)
Total Lymphocyte: 45 %
WBC: 7.6 10*3/uL (ref 3.8–10.8)

## 2022-06-11 LAB — LIPID PANEL
Cholesterol: 155 mg/dL (ref <200)
HDL: 61 mg/dL (ref >=50)
LDL Cholesterol (Calc): 81 mg/dL (calc)
Non-HDL Cholesterol (Calc): 94 mg/dL (calc) (ref <130)
Total CHOL/HDL Ratio: 2.5 (calc) (ref <5.0)
Triglycerides: 58 mg/dL (ref <150)

## 2022-06-11 LAB — HEPATITIS C ANTIBODY: Hepatitis C Ab: NONREACTIVE

## 2022-06-11 MED ORDER — DICLOFENAC SODIUM 3 % EX GEL: 1.0000 | Freq: Two times a day (BID) | CUTANEOUS | 0 refills | Status: AC | PRN

## 2022-06-11 MED ORDER — HYDROXYZINE HCL 10 MG PO TABS: 10.0000 mg | ORAL_TABLET | Freq: Three times a day (TID) | ORAL | 1 refills | Status: DC | PRN

## 2022-06-11 MED ORDER — PREDNISONE 20 MG PO TABS
40.0000 mg | ORAL_TABLET | Freq: Every day | ORAL | 0 refills | Status: AC
Start: 2022-06-11 — End: 2022-06-16

## 2022-06-11 MED ORDER — METHOCARBAMOL 500 MG PO TABS: 500.0000 mg | ORAL_TABLET | Freq: Three times a day (TID) | ORAL | 0 refills | Status: DC | PRN

## 2022-06-11 NOTE — Progress Notes (Unsigned)
Patient ID: Melinda Snyder, female    DOB: 2001-05-24, 21 y.o.   MRN: [id number]  PCP: Delsa Grana, PA-C  Chief Complaint  Patient presents with   Rash    Right arm both started about almost 2 months ago. Rash is scattered on the arm and it itches   Arm Pain    Right arm radiates from the neck, shoulder to the hand    Subjective:   Melinda Snyder is a 21 y.o. female, presents to clinic with CC of the following:  HPI   Presents for rash - scattered to right arm onset 2 months ago itchy  Right arm pain with prior injury earlier this year - she saw Dr. Ancil Boozer on 11/22/21: She developed left shoulder pain on 10/31/2020 while at work, she picked up a box from the belt to place on a wrack and developed a pinch sensation on left posterior shoulder. 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 Initially it was going to be a workman's comp claim but it was denied. She was working only 5 hours a day but feeling well now and ready to resume regular schedule. She used topical voltaren gel , used heat and ice and prn biofreeze , she is still using medication but is doing well.    Patient Active Problem List   Diagnosis Date Noted   Headache disorder 02/26/2022   Iron deficiency anemia due to chronic blood loss 01/04/2022   Mild persistent asthma without complication 41/74/0814   Anxiety disorder, unspecified 09/20/2021   Allergic rhinitis due to allergen 11/21/2018      Current Outpatient Medications:    albuterol (VENTOLIN HFA) 108 (90 Base) MCG/ACT inhaler, INHALE 2 PUFFS EVERY 4 HOURS AS NEEDED, Disp: , Rfl:    busPIRone (BUSPAR) 5 MG tablet, Take 1 tablet (5 mg total) by mouth 2 (two) times daily as needed (anxiety symptoms)., Disp: 60 tablet, Rfl: 4    Clindamycin-Benzoyl Per, Refr, gel, SMARTSIG:sparingly Topical Daily, Disp: , Rfl:    Diclofenac Sodium 3 % GEL, Apply 1 application topically 2 (two) times daily., Disp: , Rfl:    fluticasone-salmeterol (ADVAIR) 100-50 MCG/ACT AEPB, Inhale 1 puff into the lungs 2 (two) times daily., Disp: 60 each, Rfl: 11   hydrocortisone 2.5 % cream, SMARTSIG:sparingly Topical Twice Daily, Disp: , Rfl:    Iron, Ferrous Sulfate, 325 (65 Fe) MG TABS, , Disp: , Rfl:    ketoconazole (NIZORAL) 2 % shampoo, SMARTSIG:Topical 2-3 Times Weekly, Disp: , Rfl:    levocetirizine (XYZAL) 5 MG tablet, Take 1 tablet (5 mg total) by mouth every evening., Disp: 90 tablet, Rfl: 3   montelukast (SINGULAIR) 10 MG tablet, TAKE 1 TABLET BY MOUTH EVERYDAY AT BEDTIME, Disp: 90 tablet, Rfl: 3   sertraline (ZOLOFT) 50 MG tablet, Take 1 tablet (50 mg total) by mouth daily., Disp: 90 tablet, Rfl: 3   SUMAtriptan (IMITREX) 50 MG tablet, May repeat in 2 hours if headache persists or recurs., Disp: 10 tablet, Rfl: 0   TRI-LO-MARZIA 0.18/0.215/0.25 MG-25 MCG tab, TAKE 1 TABLET BY MOUTH EVERY DAY, Disp: 84 tablet, Rfl: 1   nystatin (MYCOSTATIN/NYSTOP) powder, Apply 1 application topically 3 (three) times daily. (Patient not taking: Reported on 06/08/2022), Disp: 15 g, Rfl:  0   Allergies  Allergen Reactions   Grass Extracts [Gramineae Pollens]    Tree Extract      Social History   Tobacco Use   Smoking status: Never   Smokeless tobacco: Never  Vaping Use   Vaping Use: Never used  Substance Use Topics   Alcohol use: No   Drug use: No      Chart Review Today: I have reviewed the patient's medical history in detail and updated the computerized patient record.   Review of Systems  Constitutional: Negative.   HENT: Negative.    Eyes: Negative.   Respiratory: Negative.    Cardiovascular: Negative.   Gastrointestinal: Negative.   Endocrine: Negative.   Genitourinary: Negative.   Musculoskeletal: Negative.   Skin: Negative.    Allergic/Immunologic: Negative.   Neurological: Negative.   Hematological: Negative.   Psychiatric/Behavioral: Negative.    All other systems reviewed and are negative.      Objective:   Vitals:   06/11/22 1336  BP: 104/68  Pulse: 85  Resp: 16  Temp: 98.3 F (36.8 C)  TempSrc: Oral  SpO2: 100%  Weight: 171 lb 3.2 oz (77.7 kg)  Height: 5' 6.5" (1.689 m)    Body mass index is 27.22 kg/m.  Physical Exam Vitals and nursing note reviewed.  Constitutional:      General: She is not in acute distress.    Appearance: Normal appearance. She is well-developed and normal weight. She is not ill-appearing, toxic-appearing or diaphoretic.     Comments: She appears uncomfortable/in pain  HENT:     Head: Normocephalic and atraumatic.     Nose: Nose normal.  Eyes:     General:        Right eye: No discharge.        Left eye: No discharge.     Conjunctiva/sclera: Conjunctivae normal.  Neck:     Trachea: Trachea and phonation normal. No tracheal deviation.  Cardiovascular:     Rate and Rhythm: Normal rate and regular rhythm.  Pulmonary:     Effort: Pulmonary effort is normal. No respiratory distress.     Breath sounds: No stridor.  Musculoskeletal:        General: Normal range of motion.     Cervical back: Neck supple. No edema, erythema, rigidity, torticollis or crepitus. Pain with movement and muscular tenderness present. No spinous process tenderness. Normal range of motion.  Skin:    General: Skin is warm and dry.     Findings: No rash.  Neurological:     Mental Status: She is alert.     Motor: No abnormal muscle tone.     Coordination: Coordination normal.  Psychiatric:        Behavior: Behavior normal.      Results for orders placed or performed in visit on 06/08/22  CBC with Differential/Platelet  Result Value Ref Range   WBC 7.6 3.8 - 10.8 Thousand/uL   RBC 4.86 3.80 - 5.10 Million/uL   Hemoglobin 11.7 11.7 - 15.5 g/dL   HCT 37.6 35.0 - 45.0 %   MCV 77.4 (L)  80.0 - 100.0 fL   MCH 24.1 (L) 27.0 - 33.0 pg   MCHC 31.1 (L) 32.0 - 36.0 g/dL   RDW 15.6 (H) 11.0 - 15.0 %   Platelets 287 140 - 400 Thousand/uL   MPV 9.9 7.5 - 12.5 fL   Neutro Abs 3,336 1,500 - 7,800 cells/uL   Lymphs Abs 3,420 850 - 3,900 cells/uL   Absolute Monocytes  600 200 - 950 cells/uL   Eosinophils Absolute 190 15 - 500 cells/uL   Basophils Absolute 53 0 - 200 cells/uL   Neutrophils Relative % 43.9 %   Total Lymphocyte 45.0 %   Monocytes Relative 7.9 %   Eosinophils Relative 2.5 %   Basophils Relative 0.7 %  COMPLETE METABOLIC PANEL WITH GFR  Result Value Ref Range   Glucose, Bld 84 65 - 99 mg/dL   BUN 10 7 - 25 mg/dL   Creat 0.87 0.50 - 0.96 mg/dL   eGFR 97 > OR = 60 mL/min/1.42m   BUN/Creatinine Ratio SEE NOTE: 6 - 22 (calc)   Sodium 140 135 - 146 mmol/L   Potassium 4.0 3.5 - 5.3 mmol/L   Chloride 105 98 - 110 mmol/L   CO2 26 20 - 32 mmol/L   Calcium 9.5 8.6 - 10.2 mg/dL   Total Protein 6.9 6.1 - 8.1 g/dL   Albumin 4.3 3.6 - 5.1 g/dL   Globulin 2.6 1.9 - 3.7 g/dL (calc)   AG Ratio 1.7 1.0 - 2.5 (calc)   Total Bilirubin 0.4 0.2 - 1.2 mg/dL   Alkaline phosphatase (APISO) 76 31 - 125 U/L   AST 17 10 - 30 U/L   ALT 13 6 - 29 U/L  Lipid panel  Result Value Ref Range   Cholesterol 155 <200 mg/dL   HDL 61 > OR = 50 mg/dL   Triglycerides 58 <150 mg/dL   LDL Cholesterol (Calc) 81 mg/dL (calc)   Total CHOL/HDL Ratio 2.5 <5.0 (calc)   Non-HDL Cholesterol (Calc) 94 <130 mg/dL (calc)  Hepatitis C antibody  Result Value Ref Range   Hepatitis C Ab NON-REACTIVE NON-REACTIVE       Assessment & Plan:     ICD-10-CM   1. Neck pain  M54.2 Ambulatory referral to Orthopedics    predniSONE (DELTASONE) 20 MG tablet    Diclofenac Sodium 3 % GEL    methocarbamol (ROBAXIN) 500 MG tablet   suspect cervical radiculopathy - referred to ortho, PT will likely be helpful    2. Right arm pain  M79.601 Ambulatory referral to Orthopedics    Diclofenac Sodium 3 % GEL     methocarbamol (ROBAXIN) 500 MG tablet   radiation of neck vs shoulder pain    3. Pruritus  L29.9 hydrOXYzine (ATARAX) 10 MG tablet   no visible rash, some intermittent itching to righ arm/forearm coming and going. may be related to nerve pain?radiculopathy? can try hydroxyzine for itching         LDelsa Grana PA-C 06/11/22 1:41 PM

## 2022-06-11 NOTE — Patient Instructions (Signed)
Cervical Radiculopathy  Cervical radiculopathy happens when a nerve in the neck (a cervical nerve) is pinched or bruised. This condition can happen because of an injury to the cervical spine (vertebrae) in the neck, or as part of the normal aging process. Pressure on the cervical nerves can cause pain or numbness that travels from the neck all the way down to the arm and fingers. This condition usually gets better with rest. Treatment may be needed if the condition does not improve. What are the causes? This condition may be caused by: A neck injury. A bulging (herniated) disk. Muscle spasms. Muscle tightness in the neck due to overuse. Arthritis. Breakdown or degeneration in the bones and joints of the spine (spondylosis) due to aging. Bone spurs that may develop near the cervical nerves. What are the signs or symptoms? Symptoms of this condition include: Pain. The pain may travel from the neck to the arm and hand. The pain can be severe or irritating. It may get worse when you move your neck. Numbness or tingling in your arm or hand. Weakness in the affected arm and hand, in severe cases. How is this diagnosed? This condition may be diagnosed based on your symptoms, your medical history, and a physical exam. You may also have tests, including: X-rays. CT scan. MRI. Electromyogram (EMG). Nerve conduction tests. How is this treated? In many cases, treatment is not needed for this condition. With rest, the condition usually gets better over time. If treatment is needed, options may include: Wearing a soft neck collar (cervical collar) for short periods of time. Doing physical therapy to strengthen your neck muscles. Taking medicines. These may include NSAIDs, such as ibuprofen, or oral corticosteroids. Having spinal injections, in severe cases. Having surgery. This may be needed if other treatments do not help. Different types of surgery may be done depending on the cause of this  condition. Follow these instructions at home: If you have a cervical collar: Wear it as told by your health care provider. Remove it only as told by your health care provider. Ask your health care provider if you can remove the cervical collar for cleaning and bathing. If you are allowed to remove the collar for cleaning or bathing: Follow instructions from your health care provider about how to remove the collar safely. Clean the collar by wiping it with mild soap and water and drying it completely. Take out any removable pads in the collar every 1-2 days, and wash them by hand with soap and water. Let them air-dry completely before you put them back in the collar. Check your skin under the collar for irritation or sores. If you see any, tell your health care provider. Managing pain     Take over-the-counter and prescription medicines only as told by your health care provider. If directed, put ice on the affected area. To do this: If you have a soft neck collar, remove it as told by your health care provider. Put ice in a plastic bag. Place a towel between your skin and the bag. Leave the ice on for 20 minutes, 2-3 times a day. Remove the ice if your skin turns bright red. This is very important. If you cannot feel pain, heat, or cold, you have a greater risk of damage to the area. If applying ice does not help, you can try using heat. Use the heat source that your health care provider recommends, such as a moist heat pack or a heating pad. Place a towel between   your skin and the heat source. Leave the heat on for 20-30 minutes. Remove the heat if your skin turns bright red. This is especially important if you are unable to feel pain, heat, or cold. You have a greater risk of getting burned. Try a gentle neck and shoulder massage to help relieve symptoms. Activity Rest as needed. Return to your normal activities as told by your health care provider. Ask your health care provider what  activities are safe for you. Do stretching and strengthening exercises as told by your health care provider or your physical therapist. You may have to avoid lifting. Ask your health care provider how much you can safely lift. General instructions Use a flat pillow when you sleep. Do not drive while wearing a cervical collar. If you do not have a cervical collar, ask your health care provider if it is safe to drive while your neck heals. Ask your health care provider if the medicine prescribed to you requires you to avoid driving or using machinery. Do not use any products that contain nicotine or tobacco. These products include cigarettes, chewing tobacco, and vaping devices, such as e-cigarettes. If you need help quitting, ask your health care provider. Keep all follow-up visits. This is important. Contact a health care provider if: Your condition does not improve with treatment. Get help right away if: Your pain gets much worse and is not controlled with medicines. You have weakness or numbness in your hand, arm, face, or leg. You have a high fever. You have a stiff, rigid neck. You lose control of your bowels or your bladder (have incontinence). You have trouble with walking, balance, or speaking. Summary Cervical radiculopathy happens when a nerve in the neck is pinched or bruised. A nerve can get pinched from a bulging disk, arthritis, muscle spasms, or an injury to the neck. Symptoms include pain, tingling, or numbness radiating from the neck to the arm or hand. Weakness can also occur in severe cases. Treatment may include rest, wearing a cervical collar, and physical therapy. Medicines may be prescribed to help with pain. In severe cases, injections or surgery may be needed. This information is not intended to replace advice given to you by your health care provider. Make sure you discuss any questions you have with your health care provider. Document Revised: 04/13/2021 Document  Reviewed: 04/13/2021 Elsevier Patient Education  2023 [employer]  

## 2022-06-12 ENCOUNTER — Other Ambulatory Visit: Payer: Self-pay | Admitting: Family Medicine

## 2022-06-12 DIAGNOSIS — J4520 Mild intermittent asthma, uncomplicated: Secondary | ICD-10-CM

## 2022-06-19 DIAGNOSIS — M5412 Radiculopathy, cervical region: Secondary | ICD-10-CM | POA: Diagnosis not present

## 2022-07-02 DIAGNOSIS — J3089 Other allergic rhinitis: Secondary | ICD-10-CM | POA: Diagnosis not present

## 2022-07-02 DIAGNOSIS — J4540 Moderate persistent asthma, uncomplicated: Secondary | ICD-10-CM | POA: Diagnosis not present

## 2022-07-03 ENCOUNTER — Other Ambulatory Visit: Payer: Self-pay | Admitting: Family Medicine

## 2022-07-03 DIAGNOSIS — M542 Cervicalgia: Secondary | ICD-10-CM | POA: Diagnosis not present

## 2022-07-03 DIAGNOSIS — M5412 Radiculopathy, cervical region: Secondary | ICD-10-CM | POA: Diagnosis not present

## 2022-07-03 DIAGNOSIS — F419 Anxiety disorder, unspecified: Secondary | ICD-10-CM

## 2022-07-03 DIAGNOSIS — R200 Anesthesia of skin: Secondary | ICD-10-CM | POA: Diagnosis not present

## 2022-07-03 DIAGNOSIS — R202 Paresthesia of skin: Secondary | ICD-10-CM | POA: Diagnosis not present

## 2022-07-03 DIAGNOSIS — R531 Weakness: Secondary | ICD-10-CM | POA: Diagnosis not present

## 2022-07-13 DIAGNOSIS — M25511 Pain in right shoulder: Secondary | ICD-10-CM | POA: Diagnosis not present

## 2022-07-22 ENCOUNTER — Other Ambulatory Visit: Payer: Self-pay | Admitting: Family Medicine

## 2022-07-22 DIAGNOSIS — F419 Anxiety disorder, unspecified: Secondary | ICD-10-CM

## 2022-08-07 DIAGNOSIS — M25511 Pain in right shoulder: Secondary | ICD-10-CM | POA: Diagnosis not present

## 2022-08-28 DIAGNOSIS — M79641 Pain in right hand: Secondary | ICD-10-CM | POA: Diagnosis not present

## 2022-08-30 DIAGNOSIS — M25511 Pain in right shoulder: Secondary | ICD-10-CM | POA: Diagnosis not present

## 2022-09-10 ENCOUNTER — Ambulatory Visit (INDEPENDENT_AMBULATORY_CARE_PROVIDER_SITE_OTHER): Payer: Medicaid Other | Admitting: Family Medicine

## 2022-09-10 ENCOUNTER — Encounter: Payer: Self-pay | Admitting: Family Medicine

## 2022-09-10 VITALS — BP 114/72 | HR 94 | Temp 98.8°F | Resp 16 | Ht 66.5 in | Wt 183.2 lb

## 2022-09-10 DIAGNOSIS — Z23 Encounter for immunization: Secondary | ICD-10-CM | POA: Diagnosis not present

## 2022-09-10 DIAGNOSIS — R4586 Emotional lability: Secondary | ICD-10-CM | POA: Diagnosis not present

## 2022-09-10 DIAGNOSIS — F419 Anxiety disorder, unspecified: Secondary | ICD-10-CM | POA: Diagnosis not present

## 2022-09-10 DIAGNOSIS — F321 Major depressive disorder, single episode, moderate: Secondary | ICD-10-CM

## 2022-09-10 DIAGNOSIS — D500 Iron deficiency anemia secondary to blood loss (chronic): Secondary | ICD-10-CM | POA: Diagnosis not present

## 2022-09-10 DIAGNOSIS — J4530 Mild persistent asthma, uncomplicated: Secondary | ICD-10-CM

## 2022-09-10 MED ORDER — SERTRALINE HCL 50 MG PO TABS: 75.0000 mg | ORAL_TABLET | Freq: Every day | ORAL | 1 refills | Status: DC

## 2022-09-10 MED ORDER — BUSPIRONE HCL 10 MG PO TABS: 10.0000 mg | ORAL_TABLET | Freq: Two times a day (BID) | ORAL | 1 refills | Status: DC | PRN

## 2022-09-10 NOTE — Assessment & Plan Note (Signed)
[employer] stopped paying for buspar but it was effective She has been on zoloft for about a year but recently stress/sx worsening We discussed options and looked up drug cash prices Buspar is available at [facility] for $4-10 - refills sent there - I explained that there are not a lot of substitute medications just like BuSpar so if it works well and she can afford it she should just get it at a different pharmacy Reviewed dosing she can increase the dose 5 to 10 mg up to twice a day as needed Anxiety and depressive symptoms are both worse so Zoloft will be increased from 50 mg to 75 mg and we will need to recheck in about 6 weeks

## 2022-09-10 NOTE — Patient Instructions (Signed)
Recommend trying to get established with a therapist  [facility] takes your insurance and can do an assessment and determine your needs for visits or therapy  Get your buspar at [facility] for cash/retail price  Have them run your insurance for zoloft (let me know if you need it sent to [facility])

## 2022-09-10 NOTE — Assessment & Plan Note (Signed)
Patient wishes to stop the birth control which was helping to regulate and prevent extremely heavy menses and chronic blood loss, explained that she could stop her OCP but heavy cycles will likely return and she will become more iron deficient anemia again Recheck labs today Patient can stop OCP if she wishes she may need GYN consult Encouraged her to prevent pregnancy with condoms

## 2022-09-10 NOTE — Assessment & Plan Note (Signed)
PHQ 9 positive Increase zoloft dose Recommended reestablishing with psych and therapy

## 2022-09-10 NOTE — Progress Notes (Signed)
Name: Melinda Snyder   MRN: [id number]    DOB: June 30, 2001   Date:09/10/2022       Progress Note  Chief Complaint  Patient presents with   Follow-up   Anxiety     Subjective:   Melinda Snyder is a 21 y.o. female, presents to clinic for f/up on anxiety/mood/MDD - sx worse, stress increased feeling stressed all the time She is on zoloft 50 mg daily (started about a year ago) Hydroxyzine makes her sleepy Buspar was effective but not covered by insurance - she did not try to look up discount cash prices or good rx She is here to ask about alternatives per insurance  Phq 9 and gad 7 scores positive and reviewed Worse sx are just "stress"  "everything"      09/10/2022    1:40 PM 06/11/2022    1:36 PM 06/08/2022   11:15 AM  Depression screen PHQ 2/9  Decreased Interest 2 0 0  Down, Depressed, Hopeless 1 0 0  PHQ - 2 Score 3 0 0  Altered sleeping 2 0 0  Tired, decreased energy 2 0 0  Change in appetite 3 0 0  Feeling bad or failure about yourself  0 0 0  Trouble concentrating 2 0 0  Moving slowly or fidgety/restless 2 0 0  Suicidal thoughts 0 0 0  PHQ-9 Score 14 0 0  Difficult doing work/chores Somewhat difficult Not difficult at all Not difficult at all      09/10/2022    1:54 PM 01/04/2022    8:47 AM 10/10/2021   12:53 PM 09/20/2021   11:24 AM  GAD 7 : Generalized Anxiety Score  Nervous, Anxious, on Edge 2 0 2 2  Control/stop worrying 2 0 1 3  Worry too much - different things 3 1 1 3   Trouble relaxing 2 1 3 1   Restless 1 0 0 1  Easily annoyed or irritable 3 0 2 1  Afraid - awful might happen 0 0 1 2  Total GAD 7 Score 13 2 10 13   Anxiety Difficulty Somewhat difficult Somewhat difficult Somewhat difficult     IDA -  Anemia improved last labs iron was still low She is interested in stopping OCP - which was started due to severe heavy periods Hemoglobin  Date Value Ref Range Status  06/08/2022 11.7 11.7 - 15.5 g/dL Final   11.7 - 15.5 g/dL Final   06/10/2022 97/94/8016 (L) 11.7 - 15.5 g/dL Final  55.3 74/82/7078 (L) 11.7 - 15.5 g/dL Final   Lab Results  Component Value Date   IRON 22 (L) 02/23/2022   TIBC 451 (H) 02/23/2022   FERRITIN 24 02/23/2022      Current Outpatient Medications:    ADVAIR DISKUS 100-50 MCG/ACT AEPB, INHALE 1 PUFF INTO THE LUNGS TWICE A DAY, Disp: 60 each, Rfl: 11   albuterol (VENTOLIN HFA) 108 (90 Base) MCG/ACT inhaler, INHALE 2 PUFFS EVERY 4 HOURS AS NEEDED, Disp: , Rfl:    busPIRone (BUSPAR) 10 MG tablet, Take 1 tablet (10 mg total) by mouth 2 (two) times daily as needed., Disp: 180 tablet, Rfl: 1   Clindamycin-Benzoyl Per, Refr, gel, SMARTSIG:sparingly Topical Daily, Disp: , Rfl:    hydrocortisone 2.5 % cream, SMARTSIG:sparingly Topical Twice Daily, Disp: , Rfl:    hydrOXYzine (ATARAX) 10 MG tablet, Take 1 tablet (10 mg total) by mouth 3 (three) times daily as needed for itching., Disp: 30 tablet, Rfl: 1   Iron, Ferrous Sulfate, 325 (  65 Fe) MG TABS, , Disp: , Rfl:    ketoconazole (NIZORAL) 2 % shampoo, SMARTSIG:Topical 2-3 Times Weekly, Disp: , Rfl:    levocetirizine (XYZAL) 5 MG tablet, Take 1 tablet (5 mg total) by mouth every evening., Disp: 90 tablet, Rfl: 3   methocarbamol (ROBAXIN) 500 MG tablet, Take 1 tablet (500 mg total) by mouth every 8 (eight) hours as needed for muscle spasms (muscle stiffness)., Disp: 30 tablet, Rfl: 0   montelukast (SINGULAIR) 10 MG tablet, TAKE 1 TABLET BY MOUTH EVERYDAY AT BEDTIME, Disp: 90 tablet, Rfl: 3   sertraline (ZOLOFT) 50 MG tablet, TAKE 1/2 TABLET BY MOUTH DAILY, Disp: 45 tablet, Rfl: 1   SUMAtriptan (IMITREX) 50 MG tablet, May repeat in 2 hours if headache persists or recurs., Disp: 10 tablet, Rfl: 0   TRI-LO-MARZIA 0.18/0.215/0.25 MG-25 MCG tab, TAKE 1 TABLET BY MOUTH EVERY DAY, Disp: 84 tablet, Rfl: 1   Diclofenac Sodium 3 % GEL, Apply 1 application topically 2 (two) times daily. (Patient not taking: Reported on 09/10/2022), Disp: , Rfl:    Diclofenac Sodium 3 %  GEL, Apply 1 Application topically 2 (two) times daily as needed (to affected area). (Patient not taking: Reported on 09/10/2022), Disp: 100 g, Rfl: 0  Patient Active Problem List   Diagnosis Date Noted   Headache disorder 02/26/2022   Iron deficiency anemia due to chronic blood loss 01/04/2022   Mild persistent asthma without complication 01/04/2022   Anxiety disorder, unspecified 09/20/2021   Allergic rhinitis due to allergen 11/21/2018    Past Surgical History:  Procedure Laterality Date   NO PAST SURGERIES      Family History  Problem Relation Age of Onset   Hypertension Mother    Iron deficiency Mother    GER disease Mother    Heart disease Father    Kidney disease Father     Social History   Tobacco Use   Smoking status: Never   Smokeless tobacco: Never  Vaping Use   Vaping Use: Never used  Substance Use Topics   Alcohol use: No   Drug use: No     Allergies  Allergen Reactions   Grass Extracts [Gramineae Pollens]    Tree Extract     Health Maintenance  Topic Date Due   PAP SMEAR-Modifier  Never done   INFLUENZA VACCINE  01/20/2023 (Originally 05/22/2022)   PAP-Cervical Cytology Screening  06/09/2023 (Originally 02/11/2022)   Hepatitis C Screening  Completed   HIV Screening  Completed   HPV VACCINES  Discontinued   COVID-19 Vaccine  Discontinued    Chart Review Today: I personally reviewed active problem list, medication list, allergies, family history, social history, health maintenance, notes from last encounter, lab results, imaging with the patient/caregiver today.   Review of Systems  Constitutional: Negative.   HENT: Negative.    Eyes: Negative.   Respiratory: Negative.    Cardiovascular: Negative.   Gastrointestinal: Negative.   Endocrine: Negative.   Genitourinary: Negative.   Musculoskeletal: Negative.   Skin: Negative.   Allergic/Immunologic: Negative.   Neurological: Negative.   Hematological: Negative.   Psychiatric/Behavioral:  Negative.    All other systems reviewed and are negative.    Objective:   Vitals:   09/10/22 1351  BP: 114/72  Pulse: 94  Resp: 16  Temp: 98.8 F (37.1 C)  TempSrc: Oral  SpO2: 100%  Weight: 183 lb 3.2 oz (83.1 kg)  Height: 5' 6.5" (1.689 m)    Body mass index is 29.13 kg/m.  Physical  Exam Vitals and nursing note reviewed.  Constitutional:      Appearance: She is well-developed.  HENT:     Head: Normocephalic and atraumatic.     Nose: Nose normal.  Eyes:     General:        Right eye: No discharge.        Left eye: No discharge.     Conjunctiva/sclera: Conjunctivae normal.  Neck:     Trachea: No tracheal deviation.  Cardiovascular:     Rate and Rhythm: Normal rate and regular rhythm.  Pulmonary:     Effort: Pulmonary effort is normal. No respiratory distress.     Breath sounds: No stridor.  Musculoskeletal:        General: Normal range of motion.  Skin:    General: Skin is warm and dry.     Findings: No rash.  Neurological:     Mental Status: She is alert.     Motor: No abnormal muscle tone.     Coordination: Coordination normal.  Psychiatric:        Attention and Perception: Attention normal.        Mood and Affect: Mood is depressed.        Speech: Speech normal.        Behavior: Behavior is cooperative.         Assessment & Plan:   Problem List Items Addressed This Visit       Other   Anxiety disorder, unspecified - Primary    [employer] stopped paying for buspar but it was effective She has been on zoloft for about a year but recently stress/sx worsening We discussed options and looked up drug cash prices Buspar is available at [facility] for $4-10 - refills sent there - I explained that there are not a lot of substitute medications just like BuSpar so if it works well and she can afford it she should just get it at a different pharmacy Reviewed dosing she can increase the dose 5 to 10 mg up to twice a day as needed Anxiety and depressive  symptoms are both worse so Zoloft will be increased from 50 mg to 75 mg and we will need to recheck in about 6 weeks      Relevant Medications   busPIRone (BUSPAR) 10 MG tablet   sertraline (ZOLOFT) 50 MG tablet   Other Relevant Orders   TSH   Iron deficiency anemia due to chronic blood loss    Patient wishes to stop the birth control which was helping to regulate and prevent extremely heavy menses and chronic blood loss, explained that she could stop her OCP but heavy cycles will likely return and she will become more iron deficient anemia again Recheck labs today Patient can stop OCP if she wishes she may need GYN consult Encouraged her to prevent pregnancy with condoms      Relevant Orders   CBC with Differential/Platelet   TSH   Iron, TIBC and Ferritin Panel   Current moderate episode of major depressive disorder (HCC)    PHQ 9 positive Increase zoloft dose Recommended reestablishing with psych and therapy      Relevant Medications   busPIRone (BUSPAR) 10 MG tablet   sertraline (ZOLOFT) 50 MG tablet   Other Relevant Orders   CBC with Differential/Platelet   TSH   Other Visit Diagnoses     Need for influenza vaccination       declined today   Mood changes  Relevant Medications   busPIRone (BUSPAR) 10 MG tablet   sertraline (ZOLOFT) 50 MG tablet   Other Relevant Orders   CBC with Differential/Platelet   TSH   Iron, TIBC and Ferritin Panel       Patient strongly encouraged to reestablish with a psychiatrist or therapist with her worsening symptoms  With med changes she was encouraged to follow-up here in 6-12 weeks    Danelle Berry, PA-C 09/10/22 2:19 PM

## 2022-09-11 LAB — CBC WITH DIFFERENTIAL/PLATELET
Absolute Monocytes: 661 cells/uL (ref 200–950)
Basophils Absolute: 84 cells/uL (ref 0–200)
Basophils Relative: 1.1 %
Eosinophils Absolute: 243 cells/uL (ref 15–500)
Eosinophils Relative: 3.2 %
HCT: 36.5 % (ref 35.0–45.0)
Hemoglobin: 12 g/dL (ref 11.7–15.5)
Lymphs Abs: 3276 cells/uL (ref 850–3900)
MCH: 25.3 pg — ABNORMAL LOW (ref 27.0–33.0)
MCHC: 32.9 g/dL (ref 32.0–36.0)
MCV: 77 fL — ABNORMAL LOW (ref 80.0–100.0)
MPV: 10.3 fL (ref 7.5–12.5)
Monocytes Relative: 8.7 %
Neutro Abs: 3336 cells/uL (ref 1500–7800)
Neutrophils Relative %: 43.9 %
Platelets: 267 10*3/uL (ref 140–400)
RBC: 4.74 10*6/uL (ref 3.80–5.10)
RDW: 16.8 % — ABNORMAL HIGH (ref 11.0–15.0)
Total Lymphocyte: 43.1 %
WBC: 7.6 10*3/uL (ref 3.8–10.8)

## 2022-09-11 LAB — TSH: TSH: 1.72 mIU/L

## 2022-09-11 LAB — IRON,TIBC AND FERRITIN PANEL
%SAT: 7 % (calc) — ABNORMAL LOW (ref 16–45)
Ferritin: 12 ng/mL — ABNORMAL LOW (ref 16–154)
Iron: 32 ug/dL — ABNORMAL LOW (ref 40–190)
TIBC: 446 mcg/dL (calc) (ref 250–450)

## 2022-10-11 ENCOUNTER — Other Ambulatory Visit: Payer: Self-pay | Admitting: Nurse Practitioner

## 2022-10-11 DIAGNOSIS — Z3041 Encounter for surveillance of contraceptive pills: Secondary | ICD-10-CM

## 2022-10-17 ENCOUNTER — Ambulatory Visit
Admission: EM | Admit: 2022-10-17 | Discharge: 2022-10-17 | Disposition: A | Discharge status: Home / Self Care | Payer: Medicaid Other | Attending: Internal Medicine | Admitting: Internal Medicine

## 2022-10-17 DIAGNOSIS — J029 Acute pharyngitis, unspecified: Secondary | ICD-10-CM | POA: Insufficient documentation

## 2022-10-17 LAB — GROUP A STREP BY PCR: Group A Strep by PCR: NOT DETECTED

## 2022-10-17 MED ORDER — LIDOCAINE VISCOUS HCL 2 % MT SOLN: 15.0000 mL | OROMUCOSAL | 0 refills | Status: DC | PRN

## 2022-10-17 NOTE — Discharge Instructions (Signed)
URI/COLD SYMPTOMS: Negative strep test. Your exam today is consistent with a viral illness. Antibiotics are not indicated at this time. Use medications as directed, including cough syrup, nasal saline, and decongestants. Your symptoms should improve over the next few days and resolve within 7-10 days. Increase rest and fluids. F/u if symptoms worsen or predominate such as sore throat, ear pain, productive cough, shortness of breath, or if you develop high fevers or worsening fatigue over the next several days.   ?

## 2022-10-17 NOTE — ED Triage Notes (Signed)
Chief Complaint: sore throat. No cough, fever, chills, body aches.   Onset: yesterday.   Prescriptions or OTC medications tried: Yes- cough drops, ibuprofen     with mild relief  Sick exposure: No  New foods, medications, or products: No  Recent Travel: No

## 2022-10-17 NOTE — ED Provider Notes (Signed)
[facility]    CSN: [id number] Arrival date & time: 10/17/22  1215      History   Chief Complaint Chief Complaint  Patient presents with   Sore Throat    Entered by patient    HPI Melinda Snyder is a 21 y.o. female presenting for sore throat since yesterday.  She denies any other symptoms.  No fever, fatigue, body aches, chills, headaches, cough, congestion, chest pain or shortness of breath, abdominal pain, vomiting or diarrhea.  Denies any sick contacts.  Using over-the-counter ibuprofen and cough drops.  No known exposure to flu, COVID, strep or mono.  History of asthma and allergies.  No other complaints.  HPI  Past Medical History:  Diagnosis Date   Allergy    Asthma    Seasonal allergies     Patient Active Problem List   Diagnosis Date Noted   Current moderate episode of major depressive disorder (HCC) 09/10/2022   Headache disorder 02/26/2022   Iron deficiency anemia due to chronic blood loss 01/04/2022   Mild persistent asthma without complication 01/04/2022   Anxiety disorder, unspecified 09/20/2021   Allergic rhinitis due to allergen 11/21/2018    Past Surgical History:  Procedure Laterality Date   NO PAST SURGERIES      OB History   No obstetric history on file.      Home Medications    Prior to Admission medications   Medication Sig Start Date End Date Taking? Authorizing Provider  ADVAIR DISKUS 100-50 MCG/ACT AEPB INHALE 1 PUFF INTO THE LUNGS TWICE A DAY 06/12/22  Yes Danelle Berry, PA-C  albuterol (VENTOLIN HFA) 108 (90 Base) MCG/ACT inhaler INHALE 2 PUFFS EVERY 4 HOURS AS NEEDED 12/13/15  Yes [provider]  busPIRone (BUSPAR) 10 MG tablet Take 1 tablet (10 mg total) by mouth 2 (two) times daily as needed. 09/10/22  Yes Danelle Berry, PA-C  Clindamycin-Benzoyl Per, Refr, gel SMARTSIG:sparingly Topical Daily 05/10/22  Yes [provider]  hydrocortisone 2.5 % cream SMARTSIG:sparingly Topical Twice Daily 05/10/22  Yes  [provider]  hydrOXYzine (ATARAX) 10 MG tablet Take 1 tablet (10 mg total) by mouth 3 (three) times daily as needed for itching. 06/11/22  Yes Danelle Berry, PA-C  Iron, Ferrous Sulfate, 325 (65 Fe) MG TABS  09/21/21  Yes [provider]  ketoconazole (NIZORAL) 2 % shampoo SMARTSIG:Topical 2-3 Times Weekly 05/10/22  Yes [provider]  levocetirizine (XYZAL) 5 MG tablet Take 1 tablet (5 mg total) by mouth every evening. 06/08/22  Yes Danelle Berry, PA-C  lidocaine (XYLOCAINE) 2 % solution Use as directed 15 mLs in the mouth or throat every 3 (three) hours as needed for mouth pain (swish and spit). 10/17/22  Yes Shirlee Latch, PA-C  methocarbamol (ROBAXIN) 500 MG tablet Take 1 tablet (500 mg total) by mouth every 8 (eight) hours as needed for muscle spasms (muscle stiffness). 06/11/22  Yes Danelle Berry, PA-C  montelukast (SINGULAIR) 10 MG tablet TAKE 1 TABLET BY MOUTH EVERYDAY AT BEDTIME 01/04/22  Yes Danelle Berry, PA-C  sertraline (ZOLOFT) 50 MG tablet Take 1.5 tablets (75 mg total) by mouth daily. 09/10/22  Yes Danelle Berry, PA-C  SUMAtriptan (IMITREX) 50 MG tablet May repeat in 2 hours if headache persists or recurs. 02/08/22  Yes Shirlee Latch, PA-C  TRI-LO-MARZIA 0.18/0.215/0.25 MG-25 MCG tab TAKE 1 TABLET BY MOUTH EVERY DAY 10/12/22  Yes Danelle Berry, PA-C  Diclofenac Sodium 3 % GEL Apply 1 application  topically 2 (two) times  daily. 11/09/21   [provider]  Diclofenac Sodium 3 % GEL Apply 1 Application topically 2 (two) times daily as needed (to affected area). 06/11/22   Danelle Berry, PA-C    Family History Family History  Problem Relation Age of Onset   Hypertension Mother    Iron deficiency Mother    GER disease Mother    Heart disease Father    Kidney disease Father     Social History Social History   Tobacco Use   Smoking status: Never   Smokeless tobacco: Never  Vaping Use   Vaping Use: Never used  Substance Use Topics   Alcohol use:  No   Drug use: No     Allergies   Grass extracts [gramineae pollens] and Tree extract   Review of Systems Review of Systems  Constitutional:  Negative for chills, diaphoresis, fatigue and fever.  HENT:  Positive for sore throat. Negative for congestion, ear pain, rhinorrhea, sinus pressure and sinus pain.   Respiratory:  Negative for cough and shortness of breath.   Gastrointestinal:  Negative for abdominal pain, nausea and vomiting.  Musculoskeletal:  Negative for arthralgias and myalgias.  Skin:  Negative for rash.  Neurological:  Negative for weakness and headaches.  Hematological:  Negative for adenopathy.     Physical Exam Triage Vital Signs ED Triage Vitals  Enc Vitals Group     BP 10/17/22 1441 113/72     Pulse Rate 10/17/22 1441 84     Resp 10/17/22 1441 18     Temp 10/17/22 1441 98.2 F (36.8 C)     Temp Source 10/17/22 1441 Oral     SpO2 10/17/22 1441 98 %     Weight 10/17/22 1221 190 lb (86.2 kg)     Height 10/17/22 1221 5\' 7"  (1.702 m)     Head Circumference --      Peak Flow --      Pain Score 10/17/22 1220 8     Pain Loc --      Pain Edu? --      Excl. in GC? --    No data found.  Updated Vital Signs BP 113/72 (BP Location: Left Arm)   Pulse 84   Temp 98.2 F (36.8 C) (Oral)   Resp 18   Ht 5\' 7"  (1.702 m)   Wt 190 lb (86.2 kg)   LMP 10/14/2022 (Exact Date)   SpO2 98%   BMI 29.76 kg/m     Physical Exam Vitals and nursing note reviewed.  Constitutional:      General: She is not in acute distress.    Appearance: Normal appearance. She is not ill-appearing or toxic-appearing.  HENT:     Head: Normocephalic and atraumatic.     Nose: Nose normal.     Mouth/Throat:     Mouth: Mucous membranes are moist.     Pharynx: Oropharynx is clear. Posterior oropharyngeal erythema present.  Eyes:     General: No scleral icterus.       Right eye: No discharge.        Left eye: No discharge.     Conjunctiva/sclera: Conjunctivae normal.   Cardiovascular:     Rate and Rhythm: Normal rate and regular rhythm.     Heart sounds: Normal heart sounds.  Pulmonary:     Effort: Pulmonary effort is normal. No respiratory distress.     Breath sounds: Normal breath sounds.  Musculoskeletal:     Cervical back: Neck supple.  Skin:  General: Skin is dry.  Neurological:     General: No focal deficit present.     Mental Status: She is alert. Mental status is at baseline.     Motor: No weakness.     Gait: Gait normal.  Psychiatric:        Mood and Affect: Mood normal.        Behavior: Behavior normal.        Thought Content: Thought content normal.      UC Treatments / Results  Labs (all labs ordered are listed, but only abnormal results are displayed) Labs Reviewed  GROUP A STREP BY PCR    EKG   Radiology No results found.  Procedures Procedures (including critical care time)  Medications Ordered in UC Medications - No data to display  Initial Impression / Assessment and Plan / UC Course  I have reviewed the triage vital signs and the nursing notes.  Pertinent labs & imaging results that were available during my care of the patient were reviewed by me and considered in my medical decision making (see chart for details).   21 year old female presents for sore throat since yesterday.  No fever or other symptoms.  Vitals are normal and stable and she is overall well-appearing.  Erythema posterior pharynx.  PCR strep test performed.  Needed.  Viral pharyngitis.  Sent viscous lidocaine.  Also advised ibuprofen, Tylenol, Chloraseptic spray, throat lozenges, rest and fluids.  Reviewed return precautions.   Final Clinical Impressions(s) / UC Diagnoses   Final diagnoses:  Viral pharyngitis     Discharge Instructions      URI/COLD SYMPTOMS: Negative strep test. Your exam today is consistent with a viral illness. Antibiotics are not indicated at this time. Use medications as directed, including cough syrup,  nasal saline, and decongestants. Your symptoms should improve over the next few days and resolve within 7-10 days. Increase rest and fluids. F/u if symptoms worsen or predominate such as sore throat, ear pain, productive cough, shortness of breath, or if you develop high fevers or worsening fatigue over the next several days.       ED Prescriptions     Medication Sig Dispense Auth. Provider   lidocaine (XYLOCAINE) 2 % solution Use as directed 15 mLs in the mouth or throat every 3 (three) hours as needed for mouth pain (swish and spit). 100 mL Shirlee Latch, PA-C      PDMP not reviewed this encounter.   Shirlee Latch, PA-C 10/17/22 1519

## 2022-11-12 DIAGNOSIS — J4540 Moderate persistent asthma, uncomplicated: Secondary | ICD-10-CM | POA: Diagnosis not present

## 2022-11-12 DIAGNOSIS — J329 Chronic sinusitis, unspecified: Secondary | ICD-10-CM | POA: Diagnosis not present

## 2022-12-31 ENCOUNTER — Other Ambulatory Visit: Payer: Self-pay | Admitting: Family Medicine

## 2022-12-31 DIAGNOSIS — J302 Other seasonal allergic rhinitis: Secondary | ICD-10-CM

## 2022-12-31 DIAGNOSIS — J4530 Mild persistent asthma, uncomplicated: Secondary | ICD-10-CM

## 2022-12-31 DIAGNOSIS — F419 Anxiety disorder, unspecified: Secondary | ICD-10-CM

## 2022-12-31 DIAGNOSIS — R4586 Emotional lability: Secondary | ICD-10-CM

## 2022-12-31 DIAGNOSIS — F321 Major depressive disorder, single episode, moderate: Secondary | ICD-10-CM

## 2023-01-10 ENCOUNTER — Other Ambulatory Visit: Payer: Self-pay | Admitting: Family Medicine

## 2023-01-10 DIAGNOSIS — F419 Anxiety disorder, unspecified: Secondary | ICD-10-CM

## 2023-01-10 DIAGNOSIS — R4586 Emotional lability: Secondary | ICD-10-CM

## 2023-01-10 DIAGNOSIS — F321 Major depressive disorder, single episode, moderate: Secondary | ICD-10-CM

## 2023-01-11 NOTE — Telephone Encounter (Signed)
Pt need f/u appt for refills

## 2023-01-31 DIAGNOSIS — J329 Chronic sinusitis, unspecified: Secondary | ICD-10-CM | POA: Diagnosis not present

## 2023-03-24 ENCOUNTER — Other Ambulatory Visit: Payer: Self-pay | Admitting: Family Medicine

## 2023-03-24 DIAGNOSIS — F321 Major depressive disorder, single episode, moderate: Secondary | ICD-10-CM

## 2023-03-24 DIAGNOSIS — Z3041 Encounter for surveillance of contraceptive pills: Secondary | ICD-10-CM

## 2023-03-24 DIAGNOSIS — F419 Anxiety disorder, unspecified: Secondary | ICD-10-CM

## 2023-03-24 DIAGNOSIS — R4586 Emotional lability: Secondary | ICD-10-CM

## 2023-03-25 ENCOUNTER — Other Ambulatory Visit: Payer: Self-pay | Admitting: Family Medicine

## 2023-03-25 DIAGNOSIS — R4586 Emotional lability: Secondary | ICD-10-CM

## 2023-03-25 DIAGNOSIS — F419 Anxiety disorder, unspecified: Secondary | ICD-10-CM

## 2023-03-25 DIAGNOSIS — F321 Major depressive disorder, single episode, moderate: Secondary | ICD-10-CM

## 2023-03-25 NOTE — Telephone Encounter (Signed)
Needs appt before refills   

## 2023-03-25 NOTE — Telephone Encounter (Signed)
Lvm asking pt to return call to schedule appt for refills

## 2023-03-26 ENCOUNTER — Ambulatory Visit (INDEPENDENT_AMBULATORY_CARE_PROVIDER_SITE_OTHER): Payer: Medicaid Other | Admitting: Family Medicine

## 2023-03-26 VITALS — BP 106/68 | HR 95 | Temp 98.2°F | Resp 16 | Ht 66.5 in | Wt 190.9 lb

## 2023-03-26 DIAGNOSIS — D500 Iron deficiency anemia secondary to blood loss (chronic): Secondary | ICD-10-CM

## 2023-03-26 DIAGNOSIS — Z5181 Encounter for therapeutic drug level monitoring: Secondary | ICD-10-CM

## 2023-03-26 DIAGNOSIS — J309 Allergic rhinitis, unspecified: Secondary | ICD-10-CM | POA: Diagnosis not present

## 2023-03-26 DIAGNOSIS — F419 Anxiety disorder, unspecified: Secondary | ICD-10-CM

## 2023-03-26 DIAGNOSIS — F331 Major depressive disorder, recurrent, moderate: Secondary | ICD-10-CM | POA: Diagnosis not present

## 2023-03-26 DIAGNOSIS — Z3041 Encounter for surveillance of contraceptive pills: Secondary | ICD-10-CM | POA: Diagnosis not present

## 2023-03-26 DIAGNOSIS — J4530 Mild persistent asthma, uncomplicated: Secondary | ICD-10-CM | POA: Diagnosis not present

## 2023-03-26 MED ORDER — NORGESTIM-ETH ESTRAD TRIPHASIC 0.18/0.215/0.25 MG-25 MCG PO TABS
1.0000 | ORAL_TABLET | Freq: Every day | ORAL | 1 refills | Status: DC
Start: 2023-03-26 — End: 2023-09-13

## 2023-03-26 MED ORDER — NEBULIZER/TUBING/MOUTHPIECE KIT: PACK | 0 refills | Status: DC

## 2023-03-26 MED ORDER — SERTRALINE HCL 50 MG PO TABS
50.0000 mg | ORAL_TABLET | Freq: Every day | ORAL | 3 refills | Status: DC
Start: 2023-03-26 — End: 2023-06-25

## 2023-03-26 NOTE — Assessment & Plan Note (Signed)
Uncontrolled - restart zoloft Est with psych    03/26/2023    3:32 PM 09/10/2022    1:54 PM 01/04/2022    8:47 AM 10/10/2021   12:53 PM  GAD 7 : Generalized Anxiety Score  Nervous, Anxious, on Edge 2 2 0 2  Control/stop worrying 2 2 0 1  Worry too much - different things 2 3 1 1   Trouble relaxing 1 2 1 3   Restless 1 1 0 0  Easily annoyed or irritable 1 3 0 2  Afraid - awful might happen 1 0 0 1  Total GAD 7 Score 10 13 2 10   Anxiety Difficulty Very difficult Somewhat difficult Somewhat difficult Somewhat difficult  Continue buspar prn and other coping skills, strongly recommended psych  Contact info given and referral - she is welcome to f/up here in clinic if difficulty getting est - she may need higher zoloft dose

## 2023-03-26 NOTE — Assessment & Plan Note (Signed)
Same as above - anxiety No SI    03/26/2023    3:20 PM 09/10/2022    1:40 PM 06/11/2022    1:36 PM  Depression screen PHQ 2/9  Decreased Interest 1 2 0  Down, Depressed, Hopeless 1 1 0  PHQ - 2 Score 2 3 0  Altered sleeping 2 2 0  Tired, decreased energy 2 2 0  Change in appetite 2 3 0  Feeling bad or failure about yourself  2 0 0  Trouble concentrating 2 2 0  Moving slowly or fidgety/restless 0 2 0  Suicidal thoughts 0 0 0  PHQ-9 Score 12 14 0  Difficult doing work/chores Very difficult Somewhat difficult Not difficult at all

## 2023-03-26 NOTE — Progress Notes (Signed)
Name: Melinda Snyder   MRN: [id number]    DOB: [birth year]   Date:03/26/2023       Progress Note  Chief Complaint  Patient presents with   Follow-up   Anxiety     Subjective:   Melinda Snyder is a 22 y.o. female, presents to clinic for f/up on anxiety/MDD Last OV was last year, sx were uncontrolled, medications were started and she was due for a 6 week f/up, but never returned to office    03/26/2023    3:20 PM 09/10/2022    1:40 PM 06/11/2022    1:36 PM  Depression screen PHQ 2/9  Decreased Interest 1 2 0  Down, Depressed, Hopeless 1 1 0  PHQ - 2 Score 2 3 0  Altered sleeping 2 2 0  Tired, decreased energy 2 2 0  Change in appetite 2 3 0  Feeling bad or failure about yourself  2 0 0  Trouble concentrating 2 2 0  Moving slowly or fidgety/restless 0 2 0  Suicidal thoughts 0 0 0  PHQ-9 Score 12 14 0  Difficult doing work/chores Very difficult Somewhat difficult Not difficult at all      03/26/2023    3:32 PM 09/10/2022    1:54 PM 01/04/2022    8:47 AM 10/10/2021   12:53 PM  GAD 7 : Generalized Anxiety Score  Nervous, Anxious, on Edge 2 2 0 2  Control/stop worrying 2 2 0 1  Worry too much - different things 2 3 1 1   Trouble relaxing 1 2 1 3   Restless 1 1 0 0  Easily annoyed or irritable 1 3 0 2  Afraid - awful might happen 1 0 0 1  Total GAD 7 Score 10 13 2 10   Anxiety Difficulty Very difficult Somewhat difficult Somewhat difficult Somewhat difficult   She was on zoloft 50 for about a year, score was high and we increased med dose to 75 , buspar did work for her but was not covered by insurance No f/up since Nov She recently requested a refill but it is unclear what meds and doses she has been on  Asthma on advair, singulair xyzal - not well controlled, she consulted with ENT/allergy specialists who prescribed budesonide nebs but she never got the neb machine - more sx recently with weather/pollen   03/26/23 1549  Asthma History  Symptoms >2 days/week  Nighttime  Awakenings 0-2/month  Asthma interference with normal activity Some limitations  SABA use (not for EIB) 0-2 days/wk  Risk: Exacerbations requiring oral systemic steroids 0-1 / year  Asthma Severity Mild Persistent   EIB - she uses albuterol inhaler before exercise, hot humid air also makes sx worse  OCP on trilomarzia, last Rx was Dec Due for PAP Hx of iron deficiency w/o anemia Hemoglobin  Date Value Ref Range Status  09/10/2022 12.0 11.7 - 15.5 g/dL Final  13/24/4010 27.2 11.7 - 15.5 g/dL Final  53/66/4403 47.4 11.7 - 15.5 g/dL Final  25/95/6387 56.4 (L) 11.7 - 15.5 g/dL Final   Lab Results  Component Value Date   IRON 32 (L) 09/10/2022   TIBC 446 09/10/2022   FERRITIN 12 (L) 09/10/2022    Pt never did higher dose zoloft 75 mg after last OV in November She stayed on 50 mg and was on this until she ran out only in the last couple months        Current Outpatient Medications:    albuterol (VENTOLIN HFA) 108 (90  Base) MCG/ACT inhaler, INHALE 2 PUFFS EVERY 4 HOURS AS NEEDED, Disp: , Rfl:    azelastine (ASTELIN) 0.1 % nasal spray, PLACE 2 SPRAYS INTO BOTH NOSTRILS 2 (TWO) TIMES DAILY. USE IN EACH NOSTRIL AS DIRECTED, Disp: , Rfl:    budesonide (PULMICORT) 0.5 MG/2ML nebulizer solution, Use one vial twice daily, Disp: , Rfl:    busPIRone (BUSPAR) 10 MG tablet, Take 1 tablet (10 mg total) by mouth 2 (two) times daily as needed., Disp: 180 tablet, Rfl: 1   Clindamycin-Benzoyl Per, Refr, gel, PRN, Disp: , Rfl:    Diclofenac Sodium 3 % GEL, Apply 1 application  topically 2 (two) times daily., Disp: , Rfl:    Diclofenac Sodium 3 % GEL, Apply 1 Application topically 2 (two) times daily as needed (to affected area)., Disp: 100 g, Rfl: 0   DULERA 100-5 MCG/ACT AERO, Inhale 2 puffs into the lungs 2 (two) times daily., Disp: , Rfl:    fluticasone (FLONASE) 50 MCG/ACT nasal spray, 2 sprays into each nostril daily at 0600., Disp: , Rfl:    Iron, Ferrous Sulfate, 325 (65 Fe) MG TABS, ,  Disp: , Rfl:    ketoconazole (NIZORAL) 2 % shampoo, SMARTSIG:Topical 2-3 Times Weekly, Disp: , Rfl:    levocetirizine (XYZAL) 5 MG tablet, Take 1 tablet (5 mg total) by mouth every evening., Disp: 90 tablet, Rfl: 3   lidocaine (XYLOCAINE) 2 % solution, Use as directed 15 mLs in the mouth or throat every 3 (three) hours as needed for mouth pain (swish and spit)., Disp: 100 mL, Rfl: 0   montelukast (SINGULAIR) 10 MG tablet, TAKE 1 TABLET BY MOUTH EVERYDAY AT BEDTIME, Disp: 90 tablet, Rfl: 1   nystatin cream (MYCOSTATIN), Two (2) times a day (at 8am and 12:00)., Disp: , Rfl:    Olopatadine HCl 0.6 % SOLN, Place 2 sprays into both nostrils 2 (two) times daily., Disp: , Rfl:    Respiratory Therapy Supplies (NEBULIZER/TUBING/MOUTHPIECE) KIT, Disp one nebulizer machine, tubing set and mouthpiece kit, Disp: 1 kit, Rfl: 0   sertraline (ZOLOFT) 50 MG tablet, Take 1 tablet (50 mg total) by mouth daily., Disp: 30 tablet, Rfl: 3   SUMAtriptan (IMITREX) 50 MG tablet, May repeat in 2 hours if headache persists or recurs., Disp: 10 tablet, Rfl: 0   ADVAIR DISKUS 100-50 MCG/ACT AEPB, INHALE 1 PUFF INTO THE LUNGS TWICE A DAY (Patient not taking: Reported on 03/26/2023), Disp: 60 each, Rfl: 11   hydrocortisone 2.5 % cream, SMARTSIG:sparingly Topical Twice Daily (Patient not taking: Reported on 03/26/2023), Disp: , Rfl:    methocarbamol (ROBAXIN) 500 MG tablet, Take 1 tablet (500 mg total) by mouth every 8 (eight) hours as needed for muscle spasms (muscle stiffness). (Patient not taking: Reported on 03/26/2023), Disp: 30 tablet, Rfl: 0   Norgestimate-Ethinyl Estradiol Triphasic (TRI-LO-MARZIA) 0.18/0.215/0.25 MG-25 MCG tab, Take 1 tablet by mouth daily., Disp: 84 tablet, Rfl: 1  Patient Active Problem List   Diagnosis Date Noted   Current moderate episode of major depressive disorder (HCC) 09/10/2022   Headache disorder 02/26/2022   Iron deficiency anemia due to chronic blood loss 01/04/2022   Mild persistent asthma  without complication 01/04/2022   Anxiety disorder, unspecified 09/20/2021   Allergic rhinitis due to allergen 11/21/2018    Past Surgical History:  Procedure Laterality Date   NO PAST SURGERIES      Family History  Problem Relation Age of Onset   Hypertension Mother    Iron deficiency Mother    GER disease  Mother    Heart disease Father    Kidney disease Father     Social History   Tobacco Use   Smoking status: Never   Smokeless tobacco: Never  Vaping Use   Vaping Use: Never used  Substance Use Topics   Alcohol use: No   Drug use: No     Allergies  Allergen Reactions   Grass Extracts [Gramineae Pollens]    Tree Extract     Health Maintenance  Topic Date Due   PAP SMEAR-Modifier  Never done   PAP-Cervical Cytology Screening  06/09/2023 (Originally 02/11/2022)   INFLUENZA VACCINE  05/23/2023   DTaP/Tdap/Td (2 - Td or Tdap) 05/24/2031   Hepatitis C Screening  Completed   HIV Screening  Completed   HPV VACCINES  Discontinued   COVID-19 Vaccine  Discontinued    Chart Review Today: I personally reviewed active problem list, medication list, allergies, family history, social history, health maintenance, notes from last encounter, lab results, imaging with the patient/caregiver today.   Review of Systems  Constitutional: Negative.   HENT: Negative.    Eyes: Negative.   Respiratory: Negative.    Cardiovascular: Negative.   Gastrointestinal: Negative.   Endocrine: Negative.   Genitourinary: Negative.   Musculoskeletal: Negative.   Skin: Negative.   Allergic/Immunologic: Negative.   Neurological: Negative.   Hematological: Negative.   Psychiatric/Behavioral: Negative.    All other systems reviewed and are negative.    Objective:   Vitals:   03/26/23 1527  BP: 106/68  Pulse: 95  Resp: 16  Temp: 98.2 F (36.8 C)  TempSrc: Oral  SpO2: 100%  Weight: 190 lb 14.4 oz (86.6 kg)  Height: 5' 6.5" (1.689 m)    Body mass index is 30.35 kg/m.  Physical  Exam Vitals and nursing note reviewed.  Constitutional:      General: She is not in acute distress.    Appearance: Normal appearance. She is well-developed. She is not ill-appearing, toxic-appearing or diaphoretic.  HENT:     Head: Normocephalic and atraumatic.     Nose: Nose normal.  Eyes:     General:        Right eye: No discharge.        Left eye: No discharge.     Conjunctiva/sclera: Conjunctivae normal.  Neck:     Trachea: No tracheal deviation.  Cardiovascular:     Rate and Rhythm: Normal rate and regular rhythm.  Pulmonary:     Effort: Pulmonary effort is normal. No respiratory distress.     Breath sounds: No stridor.  Musculoskeletal:        General: Normal range of motion.  Skin:    General: Skin is warm and dry.     Findings: No rash.  Neurological:     Mental Status: She is alert.     Motor: No abnormal muscle tone.     Coordination: Coordination normal.  Psychiatric:        Behavior: Behavior normal.         Assessment & Plan:   Problem List Items Addressed This Visit       Respiratory   Allergic rhinitis due to allergen    Continue same meds - she is now consulting with ENT/allergist      Mild persistent asthma without complication    Undercontrolled Neb machine ordered today and sent to local med supply location and info given to pt to call and f/up Meds changes per specialists - encouraged her to follow recommendations and  f/up as needed if needing albuterol often, frequent nighttime waking      Relevant Medications   DULERA 100-5 MCG/ACT AERO   budesonide (PULMICORT) 0.5 MG/2ML nebulizer solution     Other   Anxiety disorder, unspecified - Primary    Uncontrolled - restart zoloft Est with psych    03/26/2023    3:32 PM 09/10/2022    1:54 PM 01/04/2022    8:47 AM 10/10/2021   12:53 PM  GAD 7 : Generalized Anxiety Score  Nervous, Anxious, on Edge 2 2 0 2  Control/stop worrying 2 2 0 1  Worry too much - different things 2 3 1 1    Trouble relaxing 1 2 1 3   Restless 1 1 0 0  Easily annoyed or irritable 1 3 0 2  Afraid - awful might happen 1 0 0 1  Total GAD 7 Score 10 13 2 10   Anxiety Difficulty Very difficult Somewhat difficult Somewhat difficult Somewhat difficult  Continue buspar prn and other coping skills, strongly recommended psych  Contact info given and referral - she is welcome to f/up here in clinic if difficulty getting est - she may need higher zoloft dose        Relevant Medications   sertraline (ZOLOFT) 50 MG tablet   Other Relevant Orders   Ambulatory referral to Psychiatry   Iron deficiency anemia due to chronic blood loss    Heavy menses off OCP, will recheck labs at her next f/up appt      Current moderate episode of major depressive disorder (HCC)    Same as above - anxiety No SI    03/26/2023    3:20 PM 09/10/2022    1:40 PM 06/11/2022    1:36 PM  Depression screen PHQ 2/9  Decreased Interest 1 2 0  Down, Depressed, Hopeless 1 1 0  PHQ - 2 Score 2 3 0  Altered sleeping 2 2 0  Tired, decreased energy 2 2 0  Change in appetite 2 3 0  Feeling bad or failure about yourself  2 0 0  Trouble concentrating 2 2 0  Moving slowly or fidgety/restless 0 2 0  Suicidal thoughts 0 0 0  PHQ-9 Score 12 14 0  Difficult doing work/chores Very difficult Somewhat difficult Not difficult at all        Relevant Medications   sertraline (ZOLOFT) 50 MG tablet   Other Relevant Orders   Ambulatory referral to Psychiatry   Other Visit Diagnoses     Encounter for medication monitoring       Surveillance for birth control, oral contraceptives       not sexually active, BP and weight stable and good, requiring for heavy menses   Encounter for surveillance of contraceptive pills       refilled   Relevant Medications   Norgestimate-Ethinyl Estradiol Triphasic (TRI-LO-MARZIA) 0.18/0.215/0.25 MG-25 MCG tab        Return for 3 months f/up in office anemia, asthma, anxiety/meds f/up .   Danelle Berry,  PA-C 03/26/23 4:09 PM

## 2023-03-26 NOTE — Assessment & Plan Note (Signed)
Continue same meds - she is now consulting with ENT/allergist

## 2023-03-26 NOTE — Assessment & Plan Note (Signed)
Undercontrolled Neb machine ordered today and sent to local med supply location and info given to pt to call and f/up Meds changes per specialists - encouraged her to follow recommendations and f/up as needed if needing albuterol often, frequent nighttime waking

## 2023-03-26 NOTE — Patient Instructions (Addendum)
[facility] supply store in [place], [place] Address: [address], [place], [place] [address] Hours:  Open ? Closes 5?PM Phone: [phone]  Call to follow up about your nebulizer machine and insurance coverage for asthma We will fax it in to them today  Restart zoloft 50 mg daily and you can continue to use buspar Try to go by or call [facility] again to make an appointment for yourself. Let me know if you aren't able to get an appointment and you need me to follow up with you or adjust your meds before the 3 month follow up appointment.

## 2023-03-26 NOTE — Assessment & Plan Note (Signed)
Heavy menses off OCP, will recheck labs at her next f/up appt

## 2023-05-02 DIAGNOSIS — J4530 Mild persistent asthma, uncomplicated: Secondary | ICD-10-CM | POA: Diagnosis not present

## 2023-06-13 ENCOUNTER — Encounter: Payer: Medicaid Other | Admitting: Family Medicine

## 2023-06-25 ENCOUNTER — Ambulatory Visit (INDEPENDENT_AMBULATORY_CARE_PROVIDER_SITE_OTHER): Payer: Medicaid Other | Admitting: Family Medicine

## 2023-06-25 ENCOUNTER — Encounter: Payer: Self-pay | Admitting: Family Medicine

## 2023-06-25 VITALS — BP 118/72 | HR 96 | Temp 98.2°F | Resp 16 | Ht 66.75 in | Wt 195.9 lb

## 2023-06-25 DIAGNOSIS — Z0000 Encounter for general adult medical examination without abnormal findings: Secondary | ICD-10-CM

## 2023-06-25 DIAGNOSIS — Z13220 Encounter for screening for lipoid disorders: Secondary | ICD-10-CM

## 2023-06-25 DIAGNOSIS — F331 Major depressive disorder, recurrent, moderate: Secondary | ICD-10-CM | POA: Diagnosis not present

## 2023-06-25 DIAGNOSIS — R4586 Emotional lability: Secondary | ICD-10-CM

## 2023-06-25 DIAGNOSIS — Z01419 Encounter for gynecological examination (general) (routine) without abnormal findings: Secondary | ICD-10-CM

## 2023-06-25 DIAGNOSIS — Z124 Encounter for screening for malignant neoplasm of cervix: Secondary | ICD-10-CM

## 2023-06-25 DIAGNOSIS — F419 Anxiety disorder, unspecified: Secondary | ICD-10-CM | POA: Diagnosis not present

## 2023-06-25 DIAGNOSIS — Z130 Encounter for screening for diseases of the blood and blood-forming organs and certain disorders involving the immune mechanism: Secondary | ICD-10-CM

## 2023-06-25 MED ORDER — SERTRALINE HCL 50 MG PO TABS
75.0000 mg | ORAL_TABLET | Freq: Every day | ORAL | 1 refills | Status: AC
Start: 2023-06-25 — End: ?

## 2023-06-25 NOTE — Patient Instructions (Addendum)
Preventive Care 74-22 Years Old, Female Preventive care refers to lifestyle choices and visits with your health care provider that can promote health and wellness. Preventive care visits are also called wellness exams. What can I expect for my preventive care visit? Counseling During your preventive care visit, your health care provider may ask about your: Medical history, including: Past medical problems. Family medical history. Pregnancy history. Current health, including: Menstrual cycle. Method of birth control. Emotional well-being. Home life and relationship well-being. Sexual activity and sexual health. Lifestyle, including: Alcohol, nicotine or tobacco, and drug use. Access to firearms. Diet, exercise, and sleep habits. Work and work Astronomer. Sunscreen use. Safety issues such as seatbelt and bike helmet use. Physical exam Your health care provider may check your: Height and weight. These may be used to calculate your BMI (body mass index). BMI is a measurement that tells if you are at a healthy weight. Waist circumference. This measures the distance around your waistline. This measurement also tells if you are at a healthy weight and may help predict your risk of certain diseases, such as type 2 diabetes and high blood pressure. Heart rate and blood pressure. Body temperature. Skin for abnormal spots. What immunizations do I need?  Vaccines are usually given at various ages, according to a schedule. Your health care provider will recommend vaccines for you based on your age, medical history, and lifestyle or other factors, such as travel or where you work. What tests do I need? Screening Your health care provider may recommend screening tests for certain conditions. This may include: Pelvic exam and Pap test. Lipid and cholesterol levels. Diabetes screening. This is done by checking your blood sugar (glucose) after you have not eaten for a while (fasting). Hepatitis  B test. Hepatitis C test. HIV (human immunodeficiency virus) test. STI (sexually transmitted infection) testing, if you are at risk. BRCA-related cancer screening. This may be done if you have a family history of breast, ovarian, tubal, or peritoneal cancers. Talk with your health care provider about your test results, treatment options, and if necessary, the need for more tests. Follow these instructions at home: Eating and drinking  Eat a healthy diet that includes fresh fruits and vegetables, whole grains, lean protein, and low-fat dairy products. Take vitamin and mineral supplements as recommended by your health care provider. Do not drink alcohol if: Your health care provider tells you not to drink. You are pregnant, may be pregnant, or are planning to become pregnant. If you drink alcohol: Limit how much you have to 0-1 drink a day. Know how much alcohol is in your drink. In the [place] one drink equals one 12 oz bottle of beer (355 mL), one 5 oz glass of wine (148 mL), or one 1 oz glass of hard liquor (44 mL). Lifestyle Brush your teeth every morning and night with fluoride toothpaste. Floss one time each day. Exercise for at least 30 minutes 5 or more days each week. Do not use any products that contain nicotine or tobacco. These products include cigarettes, chewing tobacco, and vaping devices, such as e-cigarettes. If you need help quitting, ask your health care provider. Do not use drugs. If you are sexually active, practice safe sex. Use a condom or other form of protection to prevent STIs. If you do not wish to become pregnant, use a form of birth control. If you plan to become pregnant, see your health care provider for a prepregnancy visit. Find healthy ways to manage stress, such as: Meditation,  yoga, or listening to music. Journaling. Talking to a trusted person. Spending time with friends and family. Minimize exposure to UV radiation to reduce your risk of skin  cancer. Safety Always wear your seat belt while driving or riding in a vehicle. Do not drive: If you have been drinking alcohol. Do not ride with someone who has been drinking. If you have been using any mind-altering substances or drugs. While texting. When you are tired or distracted. Wear a helmet and other protective equipment during sports activities. If you have firearms in your house, make sure you follow all gun safety procedures. Seek help if you have been physically or sexually abused. What's next? Go to your health care provider once a year for an annual wellness visit. Ask your health care provider how often you should have your eyes and teeth checked. Stay up to date on all vaccines. This information is not intended to replace advice given to you by your health care provider. Make sure you discuss any questions you have with your health care provider. Document Revised: 04/05/2021 Document Reviewed: 04/05/2021 Elsevier Patient Education  2024 [employer]  Health Maintenance, Female Adopting a healthy lifestyle and getting preventive care are important in promoting health and wellness. Ask your health care provider about: The right schedule for you to have regular tests and exams. Things you can do on your own to prevent diseases and keep yourself healthy. What should I know about diet, weight, and exercise? Eat a healthy diet  Eat a diet that includes plenty of vegetables, fruits, low-fat dairy products, and lean protein. Do not eat a lot of foods that are high in solid fats, added sugars, or sodium. Maintain a healthy weight Body mass index (BMI) is used to identify weight problems. It estimates body fat based on height and weight. Your health care provider can help determine your BMI and help you achieve or maintain a healthy weight. Get regular exercise Get regular exercise. This is one of the most important things you can do for your health. Most adults  should: Exercise for at least 150 minutes each week. The exercise should increase your heart rate and make you sweat (moderate-intensity exercise). Do strengthening exercises at least twice a week. This is in addition to the moderate-intensity exercise. Spend less time sitting. Even light physical activity can be beneficial. Watch cholesterol and blood lipids Have your blood tested for lipids and cholesterol at 22 years of age, then have this test every 5 years. Have your cholesterol levels checked more often if: Your lipid or cholesterol levels are high. You are older than 22 years of age. You are at high risk for heart disease. What should I know about cancer screening? Depending on your health history and family history, you may need to have cancer screening at various ages. This may include screening for: Breast cancer. Cervical cancer. Colorectal cancer. Skin cancer. Lung cancer. What should I know about heart disease, diabetes, and high blood pressure? Blood pressure and heart disease High blood pressure causes heart disease and increases the risk of stroke. This is more likely to develop in people who have high blood pressure readings or are overweight. Have your blood pressure checked: Every 3-5 years if you are 61-26 years of age. Every year if you are 60 years old or older. Diabetes Have regular diabetes screenings. This checks your fasting blood sugar level. Have the screening done: Once every three years after age 86 if you are at a normal weight  and have a low risk for diabetes. More often and at a younger age if you are overweight or have a high risk for diabetes. What should I know about preventing infection? Hepatitis B If you have a higher risk for hepatitis B, you should be screened for this virus. Talk with your health care provider to find out if you are at risk for hepatitis B infection. Hepatitis C Testing is recommended for: Everyone born from [birth year] through  1965. Anyone with known risk factors for hepatitis C. Sexually transmitted infections (STIs) Get screened for STIs, including gonorrhea and chlamydia, if: You are sexually active and are younger than 22 years of age. You are older than 22 years of age and your health care provider tells you that you are at risk for this type of infection. Your sexual activity has changed since you were last screened, and you are at increased risk for chlamydia or gonorrhea. Ask your health care provider if you are at risk. Ask your health care provider about whether you are at high risk for HIV. Your health care provider may recommend a prescription medicine to help prevent HIV infection. If you choose to take medicine to prevent HIV, you should first get tested for HIV. You should then be tested every 3 months for as long as you are taking the medicine. Pregnancy If you are about to stop having your period (premenopausal) and you may become pregnant, seek counseling before you get pregnant. Take 400 to 800 micrograms (mcg) of folic acid every day if you become pregnant. Ask for birth control (contraception) if you want to prevent pregnancy. Osteoporosis and menopause Osteoporosis is a disease in which the bones lose minerals and strength with aging. This can result in bone fractures. If you are 56 years old or older, or if you are at risk for osteoporosis and fractures, ask your health care provider if you should: Be screened for bone loss. Take a calcium or vitamin D supplement to lower your risk of fractures. Be given hormone replacement therapy (HRT) to treat symptoms of menopause. Follow these instructions at home: Alcohol use Do not drink alcohol if: Your health care provider tells you not to drink. You are pregnant, may be pregnant, or are planning to become pregnant. If you drink alcohol: Limit how much you have to: 0-1 drink a day. Know how much alcohol is in your drink. In the [place] one drink  equals one 12 oz bottle of beer (355 mL), one 5 oz glass of wine (148 mL), or one 1 oz glass of hard liquor (44 mL). Lifestyle Do not use any products that contain nicotine or tobacco. These products include cigarettes, chewing tobacco, and vaping devices, such as e-cigarettes. If you need help quitting, ask your health care provider. Do not use street drugs. Do not share needles. Ask your health care provider for help if you need support or information about quitting drugs. General instructions Schedule regular health, dental, and eye exams. Stay current with your vaccines. Tell your health care provider if: You often feel depressed. You have ever been abused or do not feel safe at home. Summary Adopting a healthy lifestyle and getting preventive care are important in promoting health and wellness. Follow your health care provider's instructions about healthy diet, exercising, and getting tested or screened for diseases. Follow your health care provider's instructions on monitoring your cholesterol and blood pressure. This information is not intended to replace advice given to you by your health care provider.  Make sure you discuss any questions you have with your health care provider. Document Revised: 02/27/2021 Document Reviewed: 02/27/2021 Elsevier Patient Education  2024 [employer].

## 2023-06-25 NOTE — Progress Notes (Signed)
Patient: Melinda Snyder, Female    DOB: Sep 12, 2001, 22 y.o.   MRN: [id number] Danelle Berry, PA-C Visit Date: 06/25/2023  Today's Provider: Danelle Berry, PA-C   Chief Complaint  Patient presents with   Annual Exam   Subjective:   Annual physical exam:  Current concerns: mood   Home and Environment:  Lives with: lives at home with mom and two sister Parental relations: good Friends/Peers:  friends, home body, not involved in a lot of social stuff Nutrition/Eating Behaviors:  not eating a lot of fresh foods, more takeout/fast food Sports/Exercise:  none currently   Education and Employment:  School Status: planning on going back to school - [employer] History: did some [employer] previously Work: working at [employer]  Smoking: no Secondhand smoke exposure? no Drugs/EtOH: none   Sexuality:  -Menarche:  - females:  last menses: 06/20/2023, last 3-4 d - Menstrual History: regular cycles and duration - Sexually active? yes - previously, not in years  - sexual partners in last year: 0 - contraception use: abstinence, oral contraceptives (estrogen/progesterone) - Last STI Screening: 2022  - Violence/Abuse: denies  USPSTF grade A and B recommendations - reviewed and addressed today    Mood: Suicidality and Depression:   worse, not going to work x 2 months, staying at home, problem with attention    06/25/2023   10:19 AM 03/26/2023    3:20 PM 09/10/2022    1:40 PM 06/11/2022    1:36 PM  PHQ 2/9 Scores  PHQ - 2 Score 6 2 3  0  PHQ- 9 Score 21 12 14  0      06/25/2023   10:19 AM 03/26/2023    3:20 PM 09/10/2022    1:40 PM 06/11/2022    1:36 PM 06/08/2022   11:15 AM  Depression screen PHQ 2/9  Decreased Interest 3 1 2  0 0  Down, Depressed, Hopeless 3 1 1  0 0  PHQ - 2 Score 6 2 3  0 0  Altered sleeping 3 2 2  0 0  Tired, decreased energy 3 2 2  0 0  Change in appetite 3 2 3  0 0  Feeling bad or failure about yourself  3 2 0 0 0  Trouble concentrating 3 2 2  0 0  Moving  slowly or fidgety/restless 0 0 2 0 0  Suicidal thoughts 0 0 0 0 0  PHQ-9 Score 21 12 14  0 0  Difficult doing work/chores Very difficult Very difficult Somewhat difficult Not difficult at all Not difficult at all      06/25/2023   10:24 AM 03/26/2023    3:32 PM 09/10/2022    1:54 PM 01/04/2022    8:47 AM  GAD 7 : Generalized Anxiety Score  Nervous, Anxious, on Edge 3 2 2  0  Control/stop worrying 3 2 2  0  Worry too much - different things 3 2 3 1   Trouble relaxing 3 1 2 1   Restless 3 1 1  0  Easily annoyed or irritable 3 1 3  0  Afraid - awful might happen 3 1 0 0  Total GAD 7 Score 21 10 13 2   Anxiety Difficulty Very difficult Very difficult Somewhat difficult Somewhat difficult   Screening positive, discussed meds, referrals therapy, tx optioins, denies SI/HI, AVH, self harm   Alcohol screening: Flowsheet Row Office Visit from 03/26/2023 in [facility]  AUDIT-C Score 0       Immunizations and Health Maintenance: Health Maintenance  Topic Date Due  PAP-Cervical Cytology Screening  Never done   PAP SMEAR-Modifier  Never done   INFLUENZA VACCINE  05/23/2023   DTaP/Tdap/Td (2 - Td or Tdap) 05/24/2031   Hepatitis C Screening  Completed   HIV Screening  Completed   HPV VACCINES  Discontinued   COVID-19 Vaccine  Discontinued     Hep C Screening: done  STD testing and prevention (HIV/chl/gon/syphilis):  see above, no additional testing desired by pt today  Intimate partner violence:denies   Sexual History/Pain during Intercourse: Single, not currently sexually active   Menstrual History/LMP/Abnormal Bleeding:  Patient's last menstrual period was 06/20/2023 (exact date).  Incontinence Symptoms: none  Breast cancer:  Not indicated  Cervical cancer screening: discussed recommendations/options, pt wishes to defer today, reviewed ACOG vs ACS options, screening at age 85 vs 32, she will not do today do to mood  Osteoporosis:   Not  indicated  Colorectal cancer:   Not indicated, no concerning sx  Lung cancer:   Low Dose CT Chest recommended if Age 38-80 years, 20 pack-year currently smoking OR have quit w/in 15years. Patient does not qualify.    Social History   Tobacco Use   Smoking status: Never   Smokeless tobacco: Never  Vaping Use   Vaping status: Never Used  Substance Use Topics   Alcohol use: No   Drug use: No     Flowsheet Row Office Visit from 03/26/2023 in [facility]  AUDIT-C Score 0       Family History  Problem Relation Age of Onset   Hypertension Mother    Iron deficiency Mother    GER disease Mother    Heart disease Father    Kidney disease Father      Blood pressure/Hypertension: BP Readings from Last 3 Encounters:  06/25/23 118/72  03/26/23 106/68  10/17/22 113/72    Weight/Obesity: Wt Readings from Last 3 Encounters:  06/25/23 195 lb 14.4 oz (88.9 kg)  03/26/23 190 lb 14.4 oz (86.6 kg)  10/17/22 190 lb (86.2 kg)   BMI Readings from Last 3 Encounters:  06/25/23 30.91 kg/m  03/26/23 30.35 kg/m  10/17/22 29.76 kg/m     Lipids:  Lab Results  Component Value Date   CHOL 155 06/08/2022   Lab Results  Component Value Date   HDL 61 06/08/2022   Lab Results  Component Value Date   LDLCALC 81 06/08/2022   Lab Results  Component Value Date   TRIG 58 06/08/2022   Lab Results  Component Value Date   CHOLHDL 2.5 06/08/2022   No results found for: "LDLDIRECT" Based on the results of lipid panel his/her cardiovascular risk factor ( using Poole Cohort )  in the next 10 years is: The ASCVD Risk score (Arnett DK, et al., 2019) failed to calculate for the following reasons:   The 2019 ASCVD risk score is only valid for ages 28 to 65  Glucose:  Glucose, Bld  Date Value Ref Range Status  06/08/2022 84 65 - 99 mg/dL Final    Comment:    .            Fasting reference interval .   02/23/2022 77 65 - 99 mg/dL Final    Comment:    .             Fasting reference interval .   05/23/2021 74 65 - 99 mg/dL Final    Comment:    .  Fasting reference interval .    Glucose-Capillary  Date Value Ref Range Status  04/28/2016 74 65 - 99 mg/dL Final     Social History       Social History   Socioeconomic History   Marital status: Single    Spouse name: Not on file   Number of children: Not on file   Years of education: 12   Highest education level: GED or equivalent  Occupational History   Occupation: [employer]    Comment: full time  Tobacco Use   Smoking status: Never   Smokeless tobacco: Never  Vaping Use   Vaping status: Never Used  Substance and Sexual Activity   Alcohol use: No   Drug use: No   Sexual activity: Not Currently    Partners: Male    Birth control/protection: Pill    Comment: in the past 2 years, one partner  Other Topics Concern   Not on file  Social History Narrative   Did some colllege - 1 year   Working fulltime Social research officer, government   Social Determinants of Health   Financial Resource Strain: Low Risk  (03/26/2023)   Overall Financial Resource Strain (CARDIA)    Difficulty of Paying Living Expenses: Not very hard  Food Insecurity: No Food Insecurity (03/26/2023)   Hunger Vital Sign    Worried About Running Out of Food in the Last Year: Never true    Ran Out of Food in the Last Year: Never true  Transportation Needs: No Transportation Needs (03/26/2023)   PRAPARE - Administrator, Civil Service (Medical): No    Lack of Transportation (Non-Medical): No  Physical Activity: Sufficiently Active (03/26/2023)   Exercise Vital Sign    Days of Exercise per Week: 4 days    Minutes of Exercise per Session: 130 min  Stress: Stress Concern Present (03/26/2023)   [employer] of Occupational Health - Occupational Stress Questionnaire    Feeling of Stress : Very much  Social Connections: Unknown (03/26/2023)   Social Connection and Isolation Panel [NHANES]    Frequency of  Communication with Friends and Family: Three times a week    Frequency of Social Gatherings with Friends and Family: Once a week    Attends Religious Services: More than 4 times per year    Active Member of [employer] or Organizations: No    Attends Engineer, structural: Not on file    Marital Status: Patient declined    Family History        Family History  Problem Relation Age of Onset   Hypertension Mother    Iron deficiency Mother    GER disease Mother    Heart disease Father    Kidney disease Father     Patient Active Problem List   Diagnosis Date Noted   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 USE IN EACH NOSTRIL  AS DIRECTED, Disp: , Rfl:    budesonide (PULMICORT) 0.5 MG/2ML nebulizer solution, Use one vial twice daily, Disp: , Rfl:    busPIRone (BUSPAR) 10 MG tablet, Take 1 tablet (10 mg total) by mouth 2 (two) times daily as needed., Disp: 180 tablet, Rfl: 1   Clindamycin-Benzoyl Per, Refr, gel, PRN, Disp: , Rfl:    Diclofenac Sodium 3 % GEL, Apply 1 application  topically 2 (two) times daily., Disp: , Rfl:    Diclofenac Sodium 3 % GEL, Apply 1 Application topically 2 (two) times daily as needed (to affected area)., Disp: 100 g, Rfl: 0   DULERA 100-5 MCG/ACT AERO, Inhale 2 puffs into the lungs 2 (two) times daily., Disp: , Rfl:    fluticasone (FLONASE) 50 MCG/ACT nasal spray, 2  sprays into each nostril daily at 0600., Disp: , Rfl:    hydrocortisone 2.5 % cream, , Disp: , Rfl:    Iron, Ferrous Sulfate, 325 (65 Fe) MG TABS, , Disp: , Rfl:    ketoconazole (NIZORAL) 2 % shampoo, SMARTSIG:Topical 2-3 Times Weekly, Disp: , Rfl:    levocetirizine (XYZAL) 5 MG tablet, Take 1 tablet (5 mg total) by mouth every evening., Disp: 90 tablet, Rfl: 3   methocarbamol (ROBAXIN) 500 MG tablet, Take 1 tablet (500 mg total) by mouth every 8 (eight) hours as needed for muscle spasms (muscle stiffness)., Disp: 30 tablet, Rfl: 0   montelukast (SINGULAIR) 10 MG tablet, TAKE 1 TABLET BY MOUTH EVERYDAY AT BEDTIME, Disp: 90 tablet, Rfl: 1   Norgestimate-Ethinyl Estradiol Triphasic (TRI-LO-MARZIA) 0.18/0.215/0.25 MG-25 MCG tab, Take 1 tablet by mouth daily., Disp: 84 tablet, Rfl: 1   nystatin cream (MYCOSTATIN), Two (2) times a day (at 8am and 12:00)., Disp: , Rfl:    Olopatadine HCl 0.6 % SOLN, Place 2 sprays into both nostrils 2 (two) times daily., Disp: , Rfl:    Respiratory Therapy Supplies (NEBULIZER/TUBING/MOUTHPIECE) KIT, Disp one nebulizer machine, tubing set and mouthpiece kit, Disp: 1 kit, Rfl: 0   sertraline (ZOLOFT) 50 MG tablet, Take 1 tablet (50 mg total) by mouth daily., Disp: 30 tablet, Rfl: 3   SUMAtriptan (IMITREX) 50 MG tablet, May repeat in 2 hours if headache persists or recurs., Disp: 10 tablet, Rfl: 0   ADVAIR DISKUS 100-50 MCG/ACT AEPB, INHALE 1 PUFF INTO THE LUNGS TWICE A DAY (Patient not taking: Reported on 03/26/2023), Disp: 60 each, Rfl: 11   lidocaine (XYLOCAINE) 2 % solution, Use as directed 15 mLs in the mouth or throat every 3 (three) hours as needed for mouth pain (swish and spit)., Disp: 100 mL, Rfl: 0  Allergies  Allergen Reactions   Grass Extracts [Gramineae Pollens]    Tree Extract     Patient Care Team: Danelle Berry, PA-C as PCP - General (Family Medicine)   Chart Review: I personally reviewed active problem list, medication list, allergies, family  history, social history, health maintenance, notes from last encounter, lab results, imaging with the patient/caregiver today.   Review of Systems  Constitutional: Negative.   HENT: Negative.    Eyes: Negative.   Respiratory: Negative.    Cardiovascular: Negative.   Gastrointestinal: Negative.   Endocrine: Negative.   Genitourinary: Negative.   Musculoskeletal: Negative.   Skin: Negative.   Allergic/Immunologic: Negative.   Neurological: Negative.   Hematological: Negative.   Psychiatric/Behavioral:  Positive for decreased concentration and dysphoric mood. The patient is nervous/anxious.   All other systems reviewed and are negative.         Objective:  Vitals:  Vitals:   06/25/23 1023  BP: 118/72  Pulse: 96  Resp: 16  Temp: 98.2 F (36.8 C)  TempSrc: Oral  SpO2: 98%  Weight: 195 lb 14.4 oz (88.9 kg)  Height: 5' 6.75" (1.695 m)    Body mass index is 30.91 kg/m.  Physical Exam Vitals and nursing note reviewed.  Constitutional:      General: She is not in acute distress.    Appearance: Normal appearance. She is well-developed and overweight. She is not ill-appearing, toxic-appearing or diaphoretic.  HENT:     Head: Normocephalic and atraumatic.     Right Ear: External ear normal.     Left Ear: External ear normal.  Eyes:     General: Lids are normal. No scleral icterus.       Right eye: No discharge.        Left eye: No discharge.     Conjunctiva/sclera: Conjunctivae normal.  Neck:     Trachea: Phonation normal. No tracheal deviation.  Cardiovascular:     Rate and Rhythm: Normal rate and regular rhythm.     Pulses: Normal pulses.          Radial pulses are 2+ on the right side and 2+ on the left side.       Posterior tibial pulses are 2+ on the right side and 2+ on the left side.     Heart sounds: Normal heart sounds. No murmur heard.    No friction rub. No gallop.  Pulmonary:     Effort: Pulmonary effort is normal. No respiratory distress.     Breath  sounds: Normal breath sounds. No stridor. No wheezing, rhonchi or rales.  Chest:     Chest wall: No tenderness.  Abdominal:     General: Bowel sounds are normal. There is no distension.     Palpations: Abdomen is soft.  Musculoskeletal:     Right lower leg: No edema.     Left lower leg: No edema.  Skin:    General: Skin is warm and dry.     Coloration: Skin is not jaundiced or pale.     Findings: No rash.  Neurological:     Mental Status: She is alert. Mental status is at baseline.     Motor: No abnormal muscle tone.     Gait: Gait normal.  Psychiatric:        Mood and Affect: Mood is depressed.        Speech: Speech normal.        Behavior: Behavior normal. Behavior is cooperative.        Thought Content: Thought content normal. Thought content does not include homicidal or suicidal ideation. Thought content does not include homicidal or suicidal plan.       Fall Risk:    06/25/2023   10:18 AM 03/26/2023    3:20 PM 09/10/2022    1:40 PM 06/11/2022    1:36 PM 06/08/2022   11:14 AM  Fall Risk   Falls in the past year? 0 0 0 0 0  Number falls in past yr: 0 0 0 0 0  Injury with Fall? 0 0 0 0 0  Risk for fall due to : No Fall Risks No Fall Risks No Fall Risks No Fall Risks No Fall Risks  Follow up Falls prevention discussed;Education provided;Falls evaluation completed Falls prevention discussed;Education provided;Falls evaluation completed Falls prevention discussed;Education provided;Falls evaluation completed Falls prevention discussed;Education provided Education provided;Falls prevention discussed    Functional Status  Survey: Is the patient deaf or have difficulty hearing?: No Does the patient have difficulty seeing, even when wearing glasses/contacts?: No Does the patient have difficulty concentrating, remembering, or making decisions?: No Does the patient have difficulty walking or climbing stairs?: No Does the patient have difficulty dressing or bathing?: No Does the  patient have difficulty doing errands alone such as visiting a doctor's office or shopping?: No   Assessment & Plan:    CPE completed today  USPSTF grade A and B recommendations reviewed with patient; age-appropriate recommendations, preventive care, screening tests, etc discussed and encouraged; healthy living encouraged; see AVS for patient education given to patient  Discussed importance of 150 minutes of physical activity weekly, AHA exercise recommendations given to pt in AVS/handout  Discussed importance of healthy diet:  eating lean meats and proteins, avoiding trans fats and saturated fats, avoid simple sugars and excessive carbs in diet, eat 6 servings of fruit/vegetables daily and drink plenty of water and avoid sweet beverages.    Recommended pt to do annual eye exam and routine dental exams/cleanings  Depression, alcohol, fall screening completed as documented above and per flowsheets  Advance Care planning information and packet discussed and offered today, encouraged pt to discuss with family members/spouse/partner/friends and complete Advanced directive packet and bring copy to office   Reviewed Health Maintenance: Health Maintenance  Topic Date Due   PAP-Cervical Cytology Screening  Never done   PAP SMEAR-Modifier  Never done   INFLUENZA VACCINE  05/23/2023   DTaP/Tdap/Td (2 - Td or Tdap) 05/24/2031   Hepatitis C Screening  Completed   HIV Screening  Completed   HPV VACCINES  Discontinued   COVID-19 Vaccine  Discontinued    Immunizations: Immunization History  Administered Date(s) Administered   HPV 9-valent 05/23/2021, 07/03/2021   Influenza,inj,Quad PF,6+ Mos 06/24/2019, 07/03/2021   Moderna Sars-Covid-2 Vaccination 03/17/2020, 04/14/2020   Tdap 05/23/2021   Vaccines:  HPV:  completed Flu: due- educated and discussed with patient.     ICD-10-CM   1. Annual physical exam  Z00.00 COMPLETE METABOLIC PANEL WITH GFR    CBC with Differential/Platelet    TSH     2. Screening for deficiency anemia  Z13.0 CBC with Differential/Platelet    3. Cervical cancer screening  Z12.4    declines to do today    4. Mood changes  R45.86 TSH   dose increase zoloft, refer to psych/therapist, close f/up    5. Moderate episode of recurrent major depressive disorder (HCC)  F33.1 sertraline (ZOLOFT) 50 MG tablet    6. Anxiety disorder, unspecified type  F41.9 COMPLETE METABOLIC PANEL WITH GFR    CBC with Differential/Platelet    TSH    sertraline (ZOLOFT) 50 MG tablet     Mood/psych, closer f/up on med changes 4-6 weeks Return in 6 months (on 12/23/2023). routine    Danelle Berry, Cordelia Poche 06/25/23 10:33 AM  [facility]

## 2023-06-26 ENCOUNTER — Ambulatory Visit: Payer: Medicaid Other | Admitting: Family Medicine

## 2023-06-26 LAB — CBC WITH DIFFERENTIAL/PLATELET
Absolute Monocytes: 620 {cells}/uL (ref 200–950)
Basophils Absolute: 99 {cells}/uL (ref 0–200)
Basophils Relative: 1.6 %
Eosinophils Absolute: 260 {cells}/uL (ref 15–500)
Eosinophils Relative: 4.2 %
HCT: 40.5 % (ref 35.0–45.0)
Hemoglobin: 12.7 g/dL (ref 11.7–15.5)
Lymphs Abs: 2517 {cells}/uL (ref 850–3900)
MCH: 24 pg — ABNORMAL LOW (ref 27.0–33.0)
MCHC: 31.4 g/dL — ABNORMAL LOW (ref 32.0–36.0)
MCV: 76.4 fL — ABNORMAL LOW (ref 80.0–100.0)
MPV: 10 fL (ref 7.5–12.5)
Monocytes Relative: 10 %
Neutro Abs: 2703 {cells}/uL (ref 1500–7800)
Neutrophils Relative %: 43.6 %
Platelets: 348 10*3/uL (ref 140–400)
RBC: 5.3 10*6/uL — ABNORMAL HIGH (ref 3.80–5.10)
RDW: 14.9 % (ref 11.0–15.0)
Total Lymphocyte: 40.6 %
WBC: 6.2 10*3/uL (ref 3.8–10.8)

## 2023-06-26 LAB — COMPLETE METABOLIC PANEL WITH GFR
AG Ratio: 1.4 (calc) (ref 1.0–2.5)
ALT: 12 U/L (ref 6–29)
AST: 12 U/L (ref 10–30)
Albumin: 4.2 g/dL (ref 3.6–5.1)
Alkaline phosphatase (APISO): 95 U/L (ref 31–125)
BUN: 13 mg/dL (ref 7–25)
CO2: 28 mmol/L (ref 20–32)
Calcium: 9.4 mg/dL (ref 8.6–10.2)
Chloride: 103 mmol/L (ref 98–110)
Creat: 0.81 mg/dL (ref 0.50–0.96)
Globulin: 3.1 g/dL (ref 1.9–3.7)
Glucose, Bld: 71 mg/dL (ref 65–99)
Potassium: 4.9 mmol/L (ref 3.5–5.3)
Sodium: 138 mmol/L (ref 135–146)
Total Bilirubin: 0.4 mg/dL (ref 0.2–1.2)
Total Protein: 7.3 g/dL (ref 6.1–8.1)
eGFR: 105 mL/min/{1.73_m2} (ref >=60)

## 2023-06-26 LAB — TSH: TSH: 0.95 m[IU]/L

## 2023-07-02 ENCOUNTER — Encounter: Payer: Self-pay | Admitting: Family Medicine

## 2023-07-07 ENCOUNTER — Other Ambulatory Visit: Payer: Self-pay | Admitting: Family Medicine

## 2023-07-07 DIAGNOSIS — J4530 Mild persistent asthma, uncomplicated: Secondary | ICD-10-CM

## 2023-07-07 DIAGNOSIS — J302 Other seasonal allergic rhinitis: Secondary | ICD-10-CM

## 2023-07-08 ENCOUNTER — Ambulatory Visit: Payer: Medicaid Other | Admitting: Family Medicine

## 2023-07-08 ENCOUNTER — Telehealth: Payer: Medicaid Other | Admitting: Family Medicine

## 2023-07-09 ENCOUNTER — Telehealth (INDEPENDENT_AMBULATORY_CARE_PROVIDER_SITE_OTHER): Payer: Medicaid Other | Admitting: Family Medicine

## 2023-07-09 ENCOUNTER — Encounter: Payer: Self-pay | Admitting: Family Medicine

## 2023-07-09 DIAGNOSIS — Z0289 Encounter for other administrative examinations: Secondary | ICD-10-CM

## 2023-07-09 DIAGNOSIS — F419 Anxiety disorder, unspecified: Secondary | ICD-10-CM

## 2023-07-09 NOTE — Telephone Encounter (Signed)
All forms printed and ready for PCP desk.

## 2023-07-09 NOTE — Progress Notes (Signed)
Forms for FMLA received today with virtual appt - they state behavioral health physician  I have reviewed them and consulted with Dr. Carlynn Purl - per SP forms and severity of pts sx will required trained mental health professional to manage and complete forms.  Pt called personally, LVM Will try to call her again in an hour Referral entered again Pt has also previously been encouraged to call insurance to find out covered in network psychiatric clinics that she can go to.    ICD-10-CM   1. Anxiety disorder, unspecified type  F41.9 Ambulatory referral to Psychiatry    2. Encounter for completion of form with patient  Z02.89 Ambulatory referral to Psychiatry  Forms need to be completed by [facility] provider/physician    Danelle Berry, PA-C 07/09/23 12:10 PM

## 2023-07-09 NOTE — Patient Instructions (Signed)
Please call your insurance and get in network psychiatrist offices   Here are some resources to help you if you feel you are in a mental health crisis:  National Suicide Prevention Lifeline - Call 5154648134  for help - Website with more resources: ARanked.fi  [employer] - Call 848-373-1436 for help. - Mobile Crisis Program available 24 hours a day, 365 days a year. - Available for anyone of any age in [place] & [place] counties.  [facility] - Address: [address], [place] - Telephone: [phone]  - Hours of Operation: Sunday - Saturday - 8:00 a.m. - 8:00 p.m. - Medicaid, Medicare (Government Issued Only), BCBS, and [employer] - Pay - Crisis Management, Outpatient Individual & Group Therapy, Psychiatrists on-site to provide medication management, In-Home Psychiatric Care, and Peer Support Care.  National Mobile Crisis: [phone] - Mobile Crisis Program available 24 hours a day, 365 days a year. - Available for anyone of any age in  & [facility]

## 2023-07-18 ENCOUNTER — Encounter: Payer: Self-pay | Admitting: Family Medicine

## 2023-07-18 DIAGNOSIS — F32A Depression, unspecified: Secondary | ICD-10-CM | POA: Diagnosis not present

## 2023-07-23 ENCOUNTER — Ambulatory Visit: Payer: Medicaid Other | Admitting: Family Medicine

## 2023-07-24 ENCOUNTER — Ambulatory Visit (INDEPENDENT_AMBULATORY_CARE_PROVIDER_SITE_OTHER): Payer: Medicaid Other | Admitting: Physician Assistant

## 2023-07-24 ENCOUNTER — Encounter: Payer: Self-pay | Admitting: Physician Assistant

## 2023-07-24 VITALS — BP 112/70 | HR 104 | Temp 98.2°F | Resp 16 | Ht 66.5 in | Wt 198.4 lb

## 2023-07-24 DIAGNOSIS — F419 Anxiety disorder, unspecified: Secondary | ICD-10-CM | POA: Diagnosis not present

## 2023-07-24 DIAGNOSIS — F332 Major depressive disorder, recurrent severe without psychotic features: Secondary | ICD-10-CM

## 2023-07-24 MED ORDER — SERTRALINE HCL 100 MG PO TABS
100.0000 mg | ORAL_TABLET | Freq: Every day | ORAL | 1 refills | Status: DC
Start: 2023-07-24 — End: 2023-09-04

## 2023-07-24 NOTE — Patient Instructions (Addendum)
Sleep hygiene (Practices to help improve sleep)  *Try to go to bed at the same time every night *Make your room as dark and quiet as possible for sleep (ambient noises or sleep machine is okay too) *Try to establish a bedtime routine before bed (shower, reading for a little bit, etc.) *No TVs or phone for at least to an hour before bed. (The blue light from these devices can affect your sleep-wake cycle and make it harder to go to sleep) *Avoid caffeine in the afternoon and especially before bedtime. You can try over the counter Melatonin - I recommend starting at about 3 mg per night (try to take this about 30-60 minutes before bedtime to help with falling asleep)   The goal is to establish a relaxing environment and routine that signals your body that it is time to go to sleep.

## 2023-07-24 NOTE — Progress Notes (Signed)
Acute Office Visit   Patient: Melinda Snyder   DOB: [birth year]   22 y.o. Female  MRN: [id number] Visit Date: 07/24/2023  Today's healthcare provider: Oswaldo Conroy Naje Rice, PA-C  Introduced myself to the patient as a Secondary school teacher and provided education on APPs in clinical practice.    Chief Complaint  Patient presents with   Depression   Anxiety    F/u on sertraline doing well but feeling depressed and anxious on a daily   Subjective    HPI HPI     Anxiety    Additional comments: F/u on sertraline doing well but feeling depressed and anxious on a daily      Last edited by Forde Radon, CMA on 07/24/2023 11:34 AM.      She reports she is completing intake with therapy services and hopes to have her first session later this week She states she feels like the Sertraline is helping a bit   She has been on Sertraline for about 5 months now  She denies emotional blunting or flatness She denies thoughts of hurting herself or plan at this time    Sleep She reports her sleep is not good- getting 3-4 hours per night  She works third shift during the week  Goes to bed around 5 pm most days of the week but on weekends she goes around 7-8 pm  She reports issues with staying asleep  She reports that she sometimes has nightmares but most of the time she feels like her mind is running and she is not able to calm racing thoughts       07/24/2023   11:29 AM 06/25/2023   10:19 AM 03/26/2023    3:20 PM 09/10/2022    1:40 PM 06/11/2022    1:36 PM  Depression screen PHQ 2/9  Decreased Interest 3 3 1 2  0  Down, Depressed, Hopeless 3 3 1 1  0  PHQ - 2 Score 6 6 2 3  0  Altered sleeping 3 3 2 2  0  Tired, decreased energy 3 3 2 2  0  Change in appetite 3 3 2 3  0  Feeling bad or failure about yourself  3 3 2  0 0  Trouble concentrating 3 3 2 2  0  Moving slowly or fidgety/restless 0 0 0 2 0  Suicidal thoughts 0 0 0 0 0  PHQ-9 Score 21 21 12 14  0  Difficult doing work/chores Very  difficult Very difficult Very difficult Somewhat difficult Not difficult at all       07/24/2023   11:34 AM 06/25/2023   10:24 AM 03/26/2023    3:32 PM 09/10/2022    1:54 PM  GAD 7 : Generalized Anxiety Score  Nervous, Anxious, on Edge 3 3 2 2   Control/stop worrying 3 3 2 2   Worry too much - different things 3 3 2 3   Trouble relaxing 3 3 1 2   Restless 3 3 1 1   Easily annoyed or irritable 3 3 1 3   Afraid - awful might happen 3 3 1  0  Total GAD 7 Score 21 21 10 13   Anxiety Difficulty Very difficult Very difficult Very difficult Somewhat difficult        Medications: Outpatient Medications Prior to Visit  Medication Sig   albuterol (VENTOLIN HFA) 108 (90 Base) MCG/ACT inhaler INHALE 2 PUFFS EVERY 4 HOURS AS NEEDED   azelastine (ASTELIN) 0.1 % nasal spray PLACE 2 SPRAYS INTO BOTH NOSTRILS 2 (TWO) TIMES  DAILY. USE IN EACH NOSTRIL AS DIRECTED   budesonide (PULMICORT) 0.5 MG/2ML nebulizer solution Use one vial twice daily   busPIRone (BUSPAR) 10 MG tablet Take 1 tablet (10 mg total) by mouth 2 (two) times daily as needed.   Clindamycin-Benzoyl Per, Refr, gel PRN   Diclofenac Sodium 3 % GEL Apply 1 application  topically 2 (two) times daily.   Diclofenac Sodium 3 % GEL Apply 1 Application topically 2 (two) times daily as needed (to affected area).   DULERA 100-5 MCG/ACT AERO Inhale 2 puffs into the lungs 2 (two) times daily.   fluticasone (FLONASE) 50 MCG/ACT nasal spray 2 sprays into each nostril daily at 0600.   hydrocortisone 2.5 % cream    Iron, Ferrous Sulfate, 325 (65 Fe) MG TABS    ketoconazole (NIZORAL) 2 % shampoo SMARTSIG:Topical 2-3 Times Weekly   levocetirizine (XYZAL) 5 MG tablet Take 1 tablet (5 mg total) by mouth every evening.   montelukast (SINGULAIR) 10 MG tablet TAKE 1 TABLET BY MOUTH EVERYDAY AT BEDTIME   Norgestimate-Ethinyl Estradiol Triphasic (TRI-LO-MARZIA) 0.18/0.215/0.25 MG-25 MCG tab Take 1 tablet by mouth daily.   nystatin cream (MYCOSTATIN) Two (2) times a  day (at 8am and 12:00).   Olopatadine HCl 0.6 % SOLN Place 2 sprays into both nostrils 2 (two) times daily.   Respiratory Therapy Supplies (NEBULIZER/TUBING/MOUTHPIECE) KIT Disp one nebulizer machine, tubing set and mouthpiece kit   SUMAtriptan (IMITREX) 50 MG tablet May repeat in 2 hours if headache persists or recurs.   [DISCONTINUED] methocarbamol (ROBAXIN) 500 MG tablet Take 1 tablet (500 mg total) by mouth every 8 (eight) hours as needed for muscle spasms (muscle stiffness).   [DISCONTINUED] sertraline (ZOLOFT) 50 MG tablet Take 1.5 tablets (75 mg total) by mouth daily.   [DISCONTINUED] ADVAIR DISKUS 100-50 MCG/ACT AEPB INHALE 1 PUFF INTO THE LUNGS TWICE A DAY (Patient not taking: Reported on 03/26/2023)   [DISCONTINUED] lidocaine (XYLOCAINE) 2 % solution Use as directed 15 mLs in the mouth or throat every 3 (three) hours as needed for mouth pain (swish and spit).   No facility-administered medications prior to visit.    Review of Systems  Neurological:  Negative for dizziness and headaches.  Psychiatric/Behavioral:  Positive for dysphoric mood and sleep disturbance. The patient is nervous/anxious.         Objective    BP 112/70   Pulse (!) 104   Temp 98.2 F (36.8 C) (Oral)   Resp 16   Ht 5' 6.5" (1.689 m)   Wt 198 lb 6.4 oz (90 kg)   LMP 06/20/2023 (Exact Date)   SpO2 97%   BMI 31.54 kg/m     Physical Exam Vitals reviewed.  Constitutional:      General: She is awake.     Appearance: Normal appearance. She is well-developed and well-groomed.  HENT:     Head: Normocephalic and atraumatic.  Pulmonary:     Effort: Pulmonary effort is normal.  Musculoskeletal:     Cervical back: Normal range of motion.  Neurological:     General: No focal deficit present.     Mental Status: She is alert and oriented to person, place, and time.  Psychiatric:        Attention and Perception: Attention and perception normal.        Mood and Affect: Mood and affect normal.         Speech: Speech normal.        Behavior: Behavior normal. Behavior is cooperative.  Thought Content: Thought content normal.        Judgment: Judgment normal.       No results found for any visits on 07/24/23.  Assessment & Plan      Return in about 6 weeks (around 09/04/2023) for Depression, anxiety- med changes .      Problem List Items Addressed This Visit       Other   Anxiety disorder, unspecified    Appears to be chronic, and ongoing  She has been taking Sertraline 75 mg PO every day and Buspar 10 mg PO BID  PHQ9 and GAD7 are both significantly elevated today  She is optimistic that engaging with therapy and medication will lead to mood improvement - reiterated that this is likely outcome and encouraged her to continue with therapy services After discussing several options to augment therapy; adding trazodone for sleep aid, increasing Sertraline strength, increasing Buspar dosing- we decided to increase Sertraline to 100 mg PO every day and work on sleep hygiene  May need to add Wellbutrin or make further changes if anxiety is resistant to dose increase Recommendations were provided in AVS and reviewed using melatonin to assist with sleep initiation  Follow up in 6 weeks to assess response to changes and check in with progress or sooner if concerns arise      Relevant Medications   sertraline (ZOLOFT) 100 MG tablet   Current moderate episode of major depressive disorder (HCC) - Primary    Appears to be chronic, and ongoing  She has been taking Sertraline 75 mg PO every day and Buspar 10 mg PO BID  PHQ9 and GAD7 are both significantly elevated today  She is optimistic that engaging with therapy and medication will lead to mood improvement - reiterated that this is likely outcome and encouraged her to continue with therapy services After discussing several options to augment therapy; adding trazodone for sleep aid, increasing Sertraline strength, increasing Buspar  dosing- we decided to increase Sertraline to 100 mg PO every day and work on sleep hygiene  Recommendations were provided in AVS and reviewed using melatonin to assist with sleep initiation  Follow up in 6 weeks to assess response to changes and check in with progress or sooner if concerns arise       Relevant Medications   sertraline (ZOLOFT) 100 MG tablet     Return in about 6 weeks (around 09/04/2023) for Depression, anxiety- med changes .   I, Marveen Donlon E Jeneane Pieczynski, PA-C, have reviewed all documentation for this visit. The documentation on 07/24/23 for the exam, diagnosis, procedures, and orders are all accurate and complete.   Jacquelin Hawking, MHS, PA-C [facility]

## 2023-07-24 NOTE — Assessment & Plan Note (Signed)
Appears to be chronic, and ongoing  She has been taking Sertraline 75 mg PO every day and Buspar 10 mg PO BID  PHQ9 and GAD7 are both significantly elevated today  She is optimistic that engaging with therapy and medication will lead to mood improvement - reiterated that this is likely outcome and encouraged her to continue with therapy services After discussing several options to augment therapy; adding trazodone for sleep aid, increasing Sertraline strength, increasing Buspar dosing- we decided to increase Sertraline to 100 mg PO every day and work on sleep hygiene  Recommendations were provided in AVS and reviewed using melatonin to assist with sleep initiation  Follow up in 6 weeks to assess response to changes and check in with progress or sooner if concerns arise

## 2023-07-24 NOTE — Assessment & Plan Note (Signed)
Appears to be chronic, and ongoing  She has been taking Sertraline 75 mg PO every day and Buspar 10 mg PO BID  PHQ9 and GAD7 are both significantly elevated today  She is optimistic that engaging with therapy and medication will lead to mood improvement - reiterated that this is likely outcome and encouraged her to continue with therapy services After discussing several options to augment therapy; adding trazodone for sleep aid, increasing Sertraline strength, increasing Buspar dosing- we decided to increase Sertraline to 100 mg PO every day and work on sleep hygiene  May need to add Wellbutrin or make further changes if anxiety is resistant to dose increase Recommendations were provided in AVS and reviewed using melatonin to assist with sleep initiation  Follow up in 6 weeks to assess response to changes and check in with progress or sooner if concerns arise

## 2023-08-26 DIAGNOSIS — F32A Depression, unspecified: Secondary | ICD-10-CM | POA: Diagnosis not present

## 2023-09-01 ENCOUNTER — Other Ambulatory Visit: Payer: Self-pay | Admitting: Family Medicine

## 2023-09-01 DIAGNOSIS — J302 Other seasonal allergic rhinitis: Secondary | ICD-10-CM

## 2023-09-01 DIAGNOSIS — J4530 Mild persistent asthma, uncomplicated: Secondary | ICD-10-CM

## 2023-09-04 ENCOUNTER — Ambulatory Visit (INDEPENDENT_AMBULATORY_CARE_PROVIDER_SITE_OTHER): Payer: Medicaid Other | Admitting: Physician Assistant

## 2023-09-04 VITALS — BP 112/74 | HR 93 | Temp 98.2°F | Resp 16 | Ht 66.5 in | Wt 204.2 lb

## 2023-09-04 DIAGNOSIS — F419 Anxiety disorder, unspecified: Secondary | ICD-10-CM

## 2023-09-04 DIAGNOSIS — Z23 Encounter for immunization: Secondary | ICD-10-CM

## 2023-09-04 DIAGNOSIS — F331 Major depressive disorder, recurrent, moderate: Secondary | ICD-10-CM | POA: Diagnosis not present

## 2023-09-04 MED ORDER — SERTRALINE HCL 100 MG PO TABS: 100.0000 mg | ORAL_TABLET | Freq: Every day | ORAL | 1 refills | Status: AC

## 2023-09-04 NOTE — Assessment & Plan Note (Signed)
Chronic, historic condition Appears to be improving since we increased Sertraline to 100 mg PO every day  Reviewed GAD7 and PHQ9 - both scores have significantly improved since she was seen about 6 weeks ago  Continue Zoloft  Will try increasing Buspar to BID dosing rather than once or twice PRN She is comfortable following up in about 3 months for now  Reviewed ED and return precautions if she needs to come sooner

## 2023-09-04 NOTE — Progress Notes (Signed)
Established Patient Office Visit  Name: Melinda Snyder   MRN: [id number]    DOB: [birth year]   Date:09/04/2023  Today's Provider: Jacquelin Hawking, MHS, PA-C Introduced myself to the patient as a PA-C and provided education on APPs in clinical practice.         Subjective  Chief Complaint  Chief Complaint  Patient presents with   Depression    Meds are helping   Anxiety    HPI  She reports she is doing better with recent med changes   Sleep: she reports sleep has improved, getting between 6-7 hours per night. Reports improvement in sleep initiation  She has been using Melatonin 5-10 mg at night   She feels her Zoloft is at a good dose  She denies concerns for side effects- denies palpitations, nausea, diarrhea  She reports improvement in energy levels and feels like this is getting better  She is using Buspar 1-2 times per day        09/04/2023   11:05 AM 07/24/2023   11:29 AM 06/25/2023   10:19 AM 03/26/2023    3:20 PM 09/10/2022    1:40 PM  Depression screen PHQ 2/9  Decreased Interest 1 3 3 1 2   Down, Depressed, Hopeless 1 3 3 1 1   PHQ - 2 Score 2 6 6 2 3   Altered sleeping 1 3 3 2 2   Tired, decreased energy 1 3 3 2 2   Change in appetite 1 3 3 2 3   Feeling bad or failure about yourself  1 3 3 2  0  Trouble concentrating 1 3 3 2 2   Moving slowly or fidgety/restless 0 0 0 0 2  Suicidal thoughts 0 0 0 0 0  PHQ-9 Score 7 21 21 12 14   Difficult doing work/chores Somewhat difficult Very difficult Very difficult Very difficult Somewhat difficult      09/04/2023   11:06 AM 07/24/2023   11:34 AM 06/25/2023   10:24 AM 03/26/2023    3:32 PM  GAD 7 : Generalized Anxiety Score  Nervous, Anxious, on Edge 2 3 3 2   Control/stop worrying 2 3 3 2   Worry too much - different things 2 3 3 2   Trouble relaxing 2 3 3 1   Restless 2 3 3 1   Easily annoyed or irritable 3 3 3 1   Afraid - awful might happen 1 3 3 1   Total GAD 7 Score 14 21 21 10   Anxiety Difficulty Very difficult  Very difficult Very difficult Very difficult     Patient Active Problem List   Diagnosis Date Noted   Current moderate episode of major depressive disorder (HCC) 09/10/2022   Headache disorder 02/26/2022   Iron deficiency anemia due to chronic blood loss 01/04/2022   Mild persistent asthma without complication 01/04/2022   Anxiety disorder, unspecified 09/20/2021   Allergic rhinitis due to allergen 11/21/2018    Past Surgical History:  Procedure Laterality Date   NO PAST SURGERIES      Family History  Problem Relation Age of Onset   Hypertension Mother    Iron deficiency Mother    GER disease Mother    Heart disease Father    Kidney disease Father     Social History   Tobacco Use   Smoking status: Never   Smokeless tobacco: Never  Substance Use Topics   Alcohol use: No     Current Outpatient Medications:    albuterol (VENTOLIN HFA) 108 (90  Base) MCG/ACT inhaler, INHALE 2 PUFFS EVERY 4 HOURS AS NEEDED, Disp: , Rfl:    azelastine (ASTELIN) 0.1 % nasal spray, PLACE 2 SPRAYS INTO BOTH NOSTRILS 2 (TWO) TIMES DAILY. USE IN EACH NOSTRIL AS DIRECTED, Disp: , Rfl:    budesonide (PULMICORT) 0.5 MG/2ML nebulizer solution, Use one vial twice daily, Disp: , Rfl:    busPIRone (BUSPAR) 10 MG tablet, Take 1 tablet (10 mg total) by mouth 2 (two) times daily as needed., Disp: 180 tablet, Rfl: 1   Clindamycin-Benzoyl Per, Refr, gel, PRN, Disp: , Rfl:    Diclofenac Sodium 3 % GEL, Apply 1 application  topically 2 (two) times daily., Disp: , Rfl:    Diclofenac Sodium 3 % GEL, Apply 1 Application topically 2 (two) times daily as needed (to affected area)., Disp: 100 g, Rfl: 0   DULERA 100-5 MCG/ACT AERO, Inhale 2 puffs into the lungs 2 (two) times daily., Disp: , Rfl:    fluticasone (FLONASE) 50 MCG/ACT nasal spray, 2 sprays into each nostril daily at 0600., Disp: , Rfl:    hydrocortisone 2.5 % cream, , Disp: , Rfl:    Iron, Ferrous Sulfate, 325 (65 Fe) MG TABS, , Disp: , Rfl:     ketoconazole (NIZORAL) 2 % shampoo, SMARTSIG:Topical 2-3 Times Weekly, Disp: , Rfl:    levocetirizine (XYZAL) 5 MG tablet, TAKE 1 TABLET BY MOUTH EVERY DAY IN THE EVENING, Disp: 30 tablet, Rfl: 11   montelukast (SINGULAIR) 10 MG tablet, TAKE 1 TABLET BY MOUTH EVERYDAY AT BEDTIME, Disp: 90 tablet, Rfl: 1   Norgestimate-Ethinyl Estradiol Triphasic (TRI-LO-MARZIA) 0.18/0.215/0.25 MG-25 MCG tab, Take 1 tablet by mouth daily., Disp: 84 tablet, Rfl: 1   nystatin cream (MYCOSTATIN), Two (2) times a day (at 8am and 12:00)., Disp: , Rfl:    Olopatadine HCl 0.6 % SOLN, Place 2 sprays into both nostrils 2 (two) times daily., Disp: , Rfl:    Respiratory Therapy Supplies (NEBULIZER/TUBING/MOUTHPIECE) KIT, Disp one nebulizer machine, tubing set and mouthpiece kit, Disp: 1 kit, Rfl: 0   SUMAtriptan (IMITREX) 50 MG tablet, May repeat in 2 hours if headache persists or recurs., Disp: 10 tablet, Rfl: 0   sertraline (ZOLOFT) 100 MG tablet, Take 1 tablet (100 mg total) by mouth daily., Disp: 90 tablet, Rfl: 1  Allergies  Allergen Reactions   Grass Extracts [Gramineae Pollens]    Tree Extract     I personally reviewed active problem list, medication list, health maintenance, notes from last encounter, lab results with the patient/caregiver today.   ROS  See HPI for relevant ROS   Objective  Vitals:   09/04/23 1106  BP: 112/74  Pulse: 93  Resp: 16  Temp: 98.2 F (36.8 C)  TempSrc: Oral  SpO2: 98%  Weight: 204 lb 3.2 oz (92.6 kg)  Height: 5' 6.5" (1.689 m)    Body mass index is 32.46 kg/m.  Physical Exam Vitals reviewed.  Constitutional:      General: She is awake.     Appearance: Normal appearance. She is well-developed and well-groomed.  HENT:     Head: Normocephalic and atraumatic.  Pulmonary:     Effort: Pulmonary effort is normal.  Neurological:     General: No focal deficit present.     Mental Status: She is alert and oriented to person, place, and time.     GCS: GCS eye subscore  is 4. GCS verbal subscore is 5. GCS motor subscore is 6.     Cranial Nerves: No cranial nerve deficit, dysarthria  or facial asymmetry.     Motor: No weakness or tremor.  Psychiatric:        Attention and Perception: Attention and perception normal.        Mood and Affect: Mood and affect normal.        Speech: Speech normal.        Behavior: Behavior normal. Behavior is cooperative.        Thought Content: Thought content normal.        Cognition and Memory: Cognition normal.      Recent Results (from the past 2160 hour(s))  COMPLETE METABOLIC PANEL WITH GFR     Status: None   Collection Time: 06/25/23 11:22 AM  Result Value Ref Range   Glucose, Bld 71 65 - 99 mg/dL    Comment: .            Fasting reference interval .    BUN 13 7 - 25 mg/dL   Creat 5.28 4.13 - 2.44 mg/dL   eGFR 010 > OR = 60 UV/OZD/6.64Q0   BUN/Creatinine Ratio SEE NOTE: 6 - 22 (calc)    Comment:    Not Reported: BUN and Creatinine are within    reference range. .    Sodium 138 135 - 146 mmol/L   Potassium 4.9 3.5 - 5.3 mmol/L   Chloride 103 98 - 110 mmol/L   CO2 28 20 - 32 mmol/L   Calcium 9.4 8.6 - 10.2 mg/dL   Total Protein 7.3 6.1 - 8.1 g/dL   Albumin 4.2 3.6 - 5.1 g/dL   Globulin 3.1 1.9 - 3.7 g/dL (calc)   AG Ratio 1.4 1.0 - 2.5 (calc)   Total Bilirubin 0.4 0.2 - 1.2 mg/dL   Alkaline phosphatase (APISO) 95 31 - 125 U/L   AST 12 10 - 30 U/L   ALT 12 6 - 29 U/L  CBC with Differential/Platelet     Status: Abnormal   Collection Time: 06/25/23 11:22 AM  Result Value Ref Range   WBC 6.2 3.8 - 10.8 Thousand/uL   RBC 5.30 (H) 3.80 - 5.10 Million/uL   Hemoglobin 12.7 11.7 - 15.5 g/dL   HCT 34.7 42.5 - 95.6 %   MCV 76.4 (L) 80.0 - 100.0 fL   MCH 24.0 (L) 27.0 - 33.0 pg   MCHC 31.4 (L) 32.0 - 36.0 g/dL   RDW 38.7 56.4 - 33.2 %   Platelets 348 140 - 400 Thousand/uL   MPV 10.0 7.5 - 12.5 fL   Neutro Abs 2,703 1,500 - 7,800 cells/uL   Lymphs Abs 2,517 850 - 3,900 cells/uL   Absolute Monocytes 620  200 - 950 cells/uL   Eosinophils Absolute 260 15 - 500 cells/uL   Basophils Absolute 99 0 - 200 cells/uL   Neutrophils Relative % 43.6 %   Total Lymphocyte 40.6 %   Monocytes Relative 10.0 %   Eosinophils Relative 4.2 %   Basophils Relative 1.6 %  TSH     Status: None   Collection Time: 06/25/23 11:22 AM  Result Value Ref Range   TSH 0.95 mIU/L    Comment:           Reference Range .           > or = 20 Years  0.40-4.50 .                Pregnancy Ranges           First trimester    0.26-2.66  Second trimester   0.55-2.73           Third trimester    0.43-2.91      PHQ2/9:    09/04/2023   11:05 AM 07/24/2023   11:29 AM 06/25/2023   10:19 AM 03/26/2023    3:20 PM 09/10/2022    1:40 PM  Depression screen PHQ 2/9  Decreased Interest 1 3 3 1 2   Down, Depressed, Hopeless 1 3 3 1 1   PHQ - 2 Score 2 6 6 2 3   Altered sleeping 1 3 3 2 2   Tired, decreased energy 1 3 3 2 2   Change in appetite 1 3 3 2 3   Feeling bad or failure about yourself  1 3 3 2  0  Trouble concentrating 1 3 3 2 2   Moving slowly or fidgety/restless 0 0 0 0 2  Suicidal thoughts 0 0 0 0 0  PHQ-9 Score 7 21 21 12 14   Difficult doing work/chores Somewhat difficult Very difficult Very difficult Very difficult Somewhat difficult      Fall Risk:    09/04/2023   11:02 AM 07/24/2023   11:28 AM 06/25/2023   10:18 AM 03/26/2023    3:20 PM 09/10/2022    1:40 PM  Fall Risk   Falls in the past year? 0 0 0 0 0  Number falls in past yr: 0 0 0 0 0  Injury with Fall? 0 0 0 0 0  Risk for fall due to : No Fall Risks No Fall Risks No Fall Risks No Fall Risks No Fall Risks  Follow up Falls prevention discussed;Education provided;Falls evaluation completed Falls prevention discussed;Education provided;Falls evaluation completed Falls prevention discussed;Education provided;Falls evaluation completed Falls prevention discussed;Education provided;Falls evaluation completed Falls prevention discussed;Education provided;Falls  evaluation completed      Functional Status Survey: Is the patient deaf or have difficulty hearing?: No Does the patient have difficulty seeing, even when wearing glasses/contacts?: No Does the patient have difficulty concentrating, remembering, or making decisions?: No Does the patient have difficulty walking or climbing stairs?: No Does the patient have difficulty dressing or bathing?: No Does the patient have difficulty doing errands alone such as visiting a doctor's office or shopping?: No    Assessment & Plan  Problem List Items Addressed This Visit       Other   Anxiety disorder, unspecified - Primary    Chronic, historic condition Appears to be improving since we increased Sertraline to 100 mg PO every day  Reviewed GAD7 and PHQ9 - both scores have significantly improved since she was seen about 6 weeks ago  Continue Zoloft  Will try increasing Buspar to BID dosing rather than once or twice PRN She is comfortable following up in about 3 months for now  Reviewed ED and return precautions if she needs to come sooner       Relevant Medications   sertraline (ZOLOFT) 100 MG tablet   Current moderate episode of major depressive disorder (HCC)    Chronic, ongoing, improving Reviewed PHQ9 and GAD7 results - both significantly improved since she was seen 6 weeks ago  Continue with sertraline 100 mg PO every day and will increase Buspar to BID dosing rather than PRN  Follow up in 3 months or sooner if concerns arise.       Relevant Medications   sertraline (ZOLOFT) 100 MG tablet   Other Visit Diagnoses     Need for influenza vaccination       Relevant Orders  Flu vaccine trivalent PF, 6mos and older(Flulaval,Afluria,Fluarix,Fluzone) (Completed)        Return in about 3 months (around 12/05/2023) for Depression, anxiety.   I, Deshan Hemmelgarn E Danyel Griess, PA-C, have reviewed all documentation for this visit. The documentation on 09/04/23 for the exam, diagnosis, procedures, and  orders are all accurate and complete.   Jacquelin Hawking, MHS, PA-C [facility]

## 2023-09-04 NOTE — Assessment & Plan Note (Signed)
Chronic, ongoing, improving Reviewed PHQ9 and GAD7 results - both significantly improved since she was seen 6 weeks ago  Continue with sertraline 100 mg PO every day and will increase Buspar to BID dosing rather than PRN  Follow up in 3 months or sooner if concerns arise.

## 2023-09-13 ENCOUNTER — Other Ambulatory Visit: Payer: Self-pay | Admitting: Family Medicine

## 2023-09-13 DIAGNOSIS — Z3041 Encounter for surveillance of contraceptive pills: Secondary | ICD-10-CM

## 2023-10-28 ENCOUNTER — Ambulatory Visit (INDEPENDENT_AMBULATORY_CARE_PROVIDER_SITE_OTHER): Payer: Medicaid Other | Admitting: Physician Assistant

## 2023-10-28 VITALS — BP 126/72 | HR 95 | Resp 16 | Ht 66.0 in | Wt 210.0 lb

## 2023-10-28 DIAGNOSIS — K219 Gastro-esophageal reflux disease without esophagitis: Secondary | ICD-10-CM | POA: Insufficient documentation

## 2023-10-28 DIAGNOSIS — D500 Iron deficiency anemia secondary to blood loss (chronic): Secondary | ICD-10-CM | POA: Diagnosis not present

## 2023-10-28 DIAGNOSIS — Z8639 Personal history of other endocrine, nutritional and metabolic disease: Secondary | ICD-10-CM | POA: Diagnosis not present

## 2023-10-28 NOTE — Assessment & Plan Note (Signed)
 Acute, new onset Patient presents with sternal tenderness and pain, bad taste in mouth, feeling of SOB with reclined position, altered sense of taste Suspect GERD at this time given overall benign breast, pulm and cardiac exam  Recommend starting Pepcid  and can use TUMS or Pepto for acute symptoms Information materials provided for food triggers and lifestyle changes Follow up in 3 months or sooner if concerns arise

## 2023-10-28 NOTE — Progress Notes (Signed)
 Acute Office Visit   Patient: Melinda Snyder   DOB: Nov 03, 2000   22 y.o. Female  MRN: [id number] Visit Date: 10/28/2023  Today's healthcare provider: Rocky BRAVO Telecia Larocque, PA-C  Introduced myself to the patient as a PA-C and provided education on APPs in clinical practice.    Chief Complaint  Patient presents with   Breast Pain    Left x2 weeks. Pain is constant.   Subjective    HPI HPI     Breast Pain    Additional comments: Left x2 weeks. Pain is constant.      Last edited by Dann Kirsch, CMA on 10/28/2023  3:02 PM.       Breast Pain  Onset: gradual  Duration: ongoing for 2 weeks  Associated symptoms: she has noticed some redness  Location: left breast - along entire aspect Pain level and character: 6/10 sharp and tender  She reports tenderness to palpation  She denies drainage or leaking  She denies known injuries or trauma to the area  She reports some SOB and trouble breathing over the last 2 weeks - states when she lays flat she feels like she can't breath very well Reports needing to elevate her head to breath more comfortably   Interventions: She has taken tylenol  for this  LMP: about 2 weeks ago   On birth control:  she is on OCP   She denies previous incidence of similar symptoms    She report she has been a bit constipated- last bowel movement was 2 days ago  States she has had pressure sensation along substernal area, odd taste in her mouth  She has been eating peppermint more frequently to help with taste sensation    Patient has been told in the past that she has Vitamin D  deficiency- would like labs checked today for monitoring    Medications: Outpatient Medications Prior to Visit  Medication Sig   albuterol (VENTOLIN HFA) 108 (90 Base) MCG/ACT inhaler INHALE 2 PUFFS EVERY 4 HOURS AS NEEDED   azelastine  (ASTELIN ) 0.1 % nasal spray PLACE 2 SPRAYS INTO BOTH NOSTRILS 2 (TWO) TIMES DAILY. USE IN EACH NOSTRIL AS DIRECTED   budesonide  (PULMICORT) 0.5 MG/2ML nebulizer solution Use one vial twice daily   busPIRone  (BUSPAR ) 10 MG tablet Take 1 tablet (10 mg total) by mouth 2 (two) times daily as needed.   Clindamycin-Benzoyl Per, Refr, gel PRN   Diclofenac  Sodium 3 % GEL Apply 1 application  topically 2 (two) times daily.   Diclofenac  Sodium 3 % GEL Apply 1 Application topically 2 (two) times daily as needed (to affected area).   DULERA 100-5 MCG/ACT AERO Inhale 2 puffs into the lungs 2 (two) times daily.   fluticasone  (FLONASE ) 50 MCG/ACT nasal spray 2 sprays into each nostril daily at 0600.   hydrocortisone 2.5 % cream    ketoconazole (NIZORAL) 2 % shampoo SMARTSIG:Topical 2-3 Times Weekly   levocetirizine (XYZAL ) 5 MG tablet TAKE 1 TABLET BY MOUTH EVERY DAY IN THE EVENING   montelukast  (SINGULAIR ) 10 MG tablet TAKE 1 TABLET BY MOUTH EVERYDAY AT BEDTIME   nystatin  cream (MYCOSTATIN ) Two (2) times a day (at 8am and 12:00).   Olopatadine HCl 0.6 % SOLN Place 2 sprays into both nostrils 2 (two) times daily.   Respiratory Therapy Supplies (NEBULIZER/TUBING/MOUTHPIECE) KIT Disp one nebulizer machine, tubing set and mouthpiece kit   sertraline  (ZOLOFT ) 100 MG tablet Take 1 tablet (100 mg total) by mouth daily.  SUMAtriptan  (IMITREX ) 50 MG tablet May repeat in 2 hours if headache persists or recurs.   TRI-LO-MARZIA 0.18/0.215/0.25 MG-25 MCG TABS TAKE 1 TABLET BY MOUTH EVERY DAY   Iron, Ferrous Sulfate, 325 (65 Fe) MG TABS    No facility-administered medications prior to visit.    Review of Systems  Respiratory:  Positive for shortness of breath and wheezing.   Gastrointestinal:  Positive for constipation. Negative for diarrhea, nausea and vomiting.       Odd taste in mouth  Burning in throat,  Substernal pain and pressure    Musculoskeletal:        Left breast pain          Objective    BP 126/72   Pulse 95   Resp 16   Ht 5' 6 (1.676 m)   Wt 210 lb (95.3 kg)   SpO2 99%   BMI 33.89 kg/m     Physical  Exam Vitals reviewed. Exam conducted with a chaperone present.  Constitutional:      General: She is awake.     Appearance: Normal appearance. She is well-developed and well-groomed.  HENT:     Head: Normocephalic and atraumatic.  Cardiovascular:     Rate and Rhythm: Normal rate and regular rhythm.     Pulses: Normal pulses.     Heart sounds: Normal heart sounds. No murmur heard.    No friction rub. No gallop.  Pulmonary:     Effort: Pulmonary effort is normal.     Breath sounds: Normal breath sounds. No decreased air movement. No decreased breath sounds, wheezing, rhonchi or rales.  Chest:  Breasts:    Tanner Score is 5.     Breasts are symmetrical.     Right: Tenderness present. No swelling, bleeding, inverted nipple, mass, nipple discharge or skin change.     Left: Tenderness present. No swelling, bleeding, inverted nipple, mass, nipple discharge or skin change.     Comments: Chaperoned By Clotilda Moles, CMA   Patient reports tenderness along inner portions of both breasts and sternum with palpation  Musculoskeletal:     Right lower leg: No edema.     Left lower leg: No edema.  Lymphadenopathy:     Upper Body:     Right upper body: No supraclavicular, axillary or pectoral adenopathy.     Left upper body: No supraclavicular, axillary or pectoral adenopathy.  Neurological:     Mental Status: She is alert.  Psychiatric:        Behavior: Behavior is cooperative.       No results found for any visits on 10/28/23.  Assessment & Plan      No follow-ups on file.       Problem List Items Addressed This Visit       Digestive   Gastroesophageal reflux disease - Primary   Acute, new onset Patient presents with sternal tenderness and pain, bad taste in mouth, feeling of SOB with reclined position, altered sense of taste Suspect GERD at this time given overall benign breast, pulm and cardiac exam  Recommend starting Pepcid  and can use TUMS or Pepto for acute  symptoms Information materials provided for food triggers and lifestyle changes Follow up in 3 months or sooner if concerns arise        Relevant Orders   COMPLETE METABOLIC PANEL WITH GFR     Other   Iron deficiency anemia due to chronic blood loss   Recheck labs today Results to dictate further management  Relevant Orders   CBC w/Diff/Platelet   COMPLETE METABOLIC PANEL WITH GFR   Iron, TIBC and Ferritin Panel   Other Visit Diagnoses       History of vitamin D  deficiency       Relevant Orders   Vitamin D  (25 hydroxy)        No follow-ups on file.   I, Ciela Mahajan E Millenia Waldvogel, PA-C, have reviewed all documentation for this visit. The documentation on 10/28/23 for the exam, diagnosis, procedures, and orders are all accurate and complete.   Rocky [place], MHS, PA-C [facility]

## 2023-10-28 NOTE — Patient Instructions (Signed)
 I recommend increasing your daily fiber intake- do this gradually to prevent bloating and abdominal discomfort. Try to get about 5 grams per day the first week or so then increase by 5 grams each week until you feel like you are having comfortable bowel movements. You can try taking a stool softener for a bit faster relief then following this with the fiber    To help with your suspected heartburn I recommend starting a medication called Pepcid  to help prevent flares. You can take this once per day and use TUMS or Pepto as needed for additional symptoms control

## 2023-10-28 NOTE — Assessment & Plan Note (Signed)
Recheck labs today Results to dictate further management

## 2023-10-29 LAB — COMPLETE METABOLIC PANEL WITH GFR
AG Ratio: 1.3 (calc) (ref 1.0–2.5)
ALT: 9 U/L (ref 6–29)
AST: 11 U/L (ref 10–30)
Albumin: 3.9 g/dL (ref 3.6–5.1)
Alkaline phosphatase (APISO): 90 U/L (ref 31–125)
BUN: 7 mg/dL (ref 7–25)
CO2: 26 mmol/L (ref 20–32)
Calcium: 9.1 mg/dL (ref 8.6–10.2)
Chloride: 104 mmol/L (ref 98–110)
Creat: 0.74 mg/dL (ref 0.50–0.96)
Globulin: 2.9 g/dL (ref 1.9–3.7)
Glucose, Bld: 84 mg/dL (ref 65–99)
Potassium: 4.1 mmol/L (ref 3.5–5.3)
Sodium: 139 mmol/L (ref 135–146)
Total Bilirubin: 0.3 mg/dL (ref 0.2–1.2)
Total Protein: 6.8 g/dL (ref 6.1–8.1)
eGFR: 117 mL/min/{1.73_m2} (ref >=60)

## 2023-10-29 LAB — CBC WITH DIFFERENTIAL/PLATELET
Absolute Lymphocytes: 2433 {cells}/uL (ref 850–3900)
Absolute Monocytes: 774 {cells}/uL (ref 200–950)
Basophils Absolute: 71 {cells}/uL (ref 0–200)
Basophils Relative: 0.9 %
Eosinophils Absolute: 261 {cells}/uL (ref 15–500)
Eosinophils Relative: 3.3 %
HCT: 37.6 % (ref 35.0–45.0)
Hemoglobin: 11.7 g/dL (ref 11.7–15.5)
MCH: 23.5 pg — ABNORMAL LOW (ref 27.0–33.0)
MCHC: 31.1 g/dL — ABNORMAL LOW (ref 32.0–36.0)
MCV: 75.5 fL — ABNORMAL LOW (ref 80.0–100.0)
MPV: 9.9 fL (ref 7.5–12.5)
Monocytes Relative: 9.8 %
Neutro Abs: 4361 {cells}/uL (ref 1500–7800)
Neutrophils Relative %: 55.2 %
Platelets: 293 10*3/uL (ref 140–400)
RBC: 4.98 10*6/uL (ref 3.80–5.10)
RDW: 15.4 % — ABNORMAL HIGH (ref 11.0–15.0)
Total Lymphocyte: 30.8 %
WBC: 7.9 10*3/uL (ref 3.8–10.8)

## 2023-10-29 LAB — IRON,TIBC AND FERRITIN PANEL
%SAT: 8 % — ABNORMAL LOW (ref 16–45)
Ferritin: 16 ng/mL (ref 16–154)
Iron: 28 ug/dL — ABNORMAL LOW (ref 40–190)
TIBC: 372 ug/dL (ref 250–450)

## 2023-10-29 LAB — VITAMIN D 25 HYDROXY (VIT D DEFICIENCY, FRACTURES): Vit D, 25-Hydroxy: 39 ng/mL (ref 30–100)

## 2023-10-31 DIAGNOSIS — F32A Depression, unspecified: Secondary | ICD-10-CM | POA: Diagnosis not present

## 2023-10-31 NOTE — Progress Notes (Signed)
 Your labs are back Your electrolytes, liver and kidney function were overall normal at this time Your Vitamin D  is in normal range but I recommend getting about 15 minutes of sunlight per day and taking a 600-800 units supplement to make sure this does not get low again.  Your iron is still a bit low. Please start taking an oral iron supplement to help with this Your CBC did not show active anemia (low hemoglobin or RBCs) but there are some changes to the compositions of your RBCs that can be caused by low iron so I further recommend supplementation for now.  Please let us  know if you have further questions or concerns.

## 2023-11-06 DIAGNOSIS — H5213 Myopia, bilateral: Secondary | ICD-10-CM | POA: Diagnosis not present

## 2023-11-09 ENCOUNTER — Other Ambulatory Visit: Payer: Self-pay | Admitting: Family Medicine

## 2023-11-09 DIAGNOSIS — F419 Anxiety disorder, unspecified: Secondary | ICD-10-CM

## 2023-11-13 ENCOUNTER — Other Ambulatory Visit: Payer: Self-pay | Admitting: Family Medicine

## 2023-11-13 DIAGNOSIS — F419 Anxiety disorder, unspecified: Secondary | ICD-10-CM

## 2023-11-14 ENCOUNTER — Ambulatory Visit: Payer: Medicaid Other | Admitting: Physician Assistant

## 2023-11-14 DIAGNOSIS — F32A Depression, unspecified: Secondary | ICD-10-CM | POA: Diagnosis not present

## 2023-11-14 NOTE — Telephone Encounter (Signed)
Requested medications are due for refill today.  unsure  Requested medications are on the active medications list.  no  Last refill. See note  Future visit scheduled.   yes  Notes to clinic.  This is the 2nd request for this medication. Pt is no longer on 5 mg BID, she is on 10. Perhaps this dosage is what needs to be refilled?     Requested Prescriptions  Pending Prescriptions Disp Refills   busPIRone (BUSPAR) 5 MG tablet [Pharmacy Med Name: BUSPIRONE HCL 5 MG TABLET] 60 tablet 4    Sig: Take 1 tablet (5 mg total) by mouth 2 (two) times daily as needed (anxiety symptoms).     Psychiatry: Anxiolytics/Hypnotics - Non-controlled Passed - 11/14/2023  7:42 AM      Passed - Valid encounter within last 12 months    Recent Outpatient Visits           2 weeks ago Gastroesophageal reflux disease, unspecified whether esophagitis present   [facility] Mecum, Oswaldo Conroy, PA-C   2 months ago Anxiety disorder, unspecified type   [facility] Mecum, Erin E, PA-C   3 months ago Severe episode of recurrent major depressive disorder, without psychotic features [facility])   [facility] [facility] Mecum, Erin E, PA-C   4 months ago Anxiety disorder, unspecified type   [facility] Danelle Berry, PA-C   4 months ago Annual physical exam   [facility] Danelle Berry, [place]       Future Appointments             Tomorrow Danelle Berry, PA-C [facility]   In 3 weeks Danelle Berry, PA-C [facility]

## 2023-11-15 ENCOUNTER — Ambulatory Visit: Payer: Medicaid Other | Admitting: Family Medicine

## 2023-11-18 ENCOUNTER — Other Ambulatory Visit (HOSPITAL_COMMUNITY)
Admission: RE | Admit: 2023-11-18 | Discharge: 2023-11-18 | Disposition: A | Discharge status: Home / Self Care | Payer: Medicaid Other | Source: Ambulatory Visit | Attending: Family Medicine | Admitting: Family Medicine

## 2023-11-18 ENCOUNTER — Ambulatory Visit: Payer: Medicaid Other | Admitting: Family Medicine

## 2023-11-18 ENCOUNTER — Encounter: Payer: Self-pay | Admitting: Family Medicine

## 2023-11-18 VITALS — BP 122/68 | HR 70 | Resp 16 | Ht 66.0 in | Wt 210.0 lb

## 2023-11-18 DIAGNOSIS — Z760 Encounter for issue of repeat prescription: Secondary | ICD-10-CM | POA: Diagnosis not present

## 2023-11-18 DIAGNOSIS — N898 Other specified noninflammatory disorders of vagina: Secondary | ICD-10-CM | POA: Diagnosis not present

## 2023-11-18 MED ORDER — BUSPIRONE HCL 5 MG PO TABS: 5.0000 mg | ORAL_TABLET | Freq: Two times a day (BID) | ORAL | 1 refills | Status: DC

## 2023-11-18 NOTE — Progress Notes (Signed)
Patient ID: Melinda Snyder, female    DOB: 06-May-2001, 23 y.o.   MRN: [id number]  PCP: Danelle Berry, PA-C  Chief Complaint  Patient presents with   Vaginal Itching    X2 weeks, OTC meds didn't help.    Subjective:   Melinda Snyder is a 23 y.o. 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 USE IN EACH NOSTRIL AS DIRECTED, Disp: , Rfl:    budesonide (PULMICORT) 0.5 MG/2ML nebulizer solution, Use one vial twice daily, Disp: , Rfl:    busPIRone (BUSPAR) 10 MG tablet, Take 1 tablet (10 mg total) by mouth 2 (two) times daily as needed., Disp: 180 tablet, Rfl: 1   Clindamycin-Benzoyl Per, Refr, gel, PRN, Disp: , Rfl:    Diclofenac Sodium 3 % GEL, Apply 1 Application topically 2 (two) times daily as needed (to affected area)., Disp: 100 g, Rfl: 0   DULERA 100-5 MCG/ACT AERO, Inhale 2 puffs into the lungs 2 (two) times daily., Disp: , Rfl:    fluticasone (FLONASE) 50 MCG/ACT nasal spray, 2 sprays into each nostril daily at 0600., Disp: , Rfl:    hydrocortisone 2.5 % cream, , Disp: , Rfl:     ketoconazole (NIZORAL) 2 % shampoo, SMARTSIG:Topical 2-3 Times Weekly, Disp: , Rfl:    levocetirizine (XYZAL) 5 MG tablet, TAKE 1 TABLET BY MOUTH EVERY DAY IN THE EVENING, Disp: 30 tablet, Rfl: 11   montelukast (SINGULAIR) 10 MG tablet, TAKE 1 TABLET BY MOUTH EVERYDAY AT BEDTIME, Disp: 90 tablet, Rfl: 1   nystatin cream (MYCOSTATIN), Two (2) times a day (at 8am and 12:00)., Disp: , Rfl:    Olopatadine HCl 0.6 % SOLN, Place 2 sprays into both nostrils 2 (two) times daily., Disp: , Rfl:    Respiratory Therapy Supplies (NEBULIZER/TUBING/MOUTHPIECE) KIT, Disp one nebulizer machine, tubing set and mouthpiece kit, Disp: 1 kit, Rfl: 0   sertraline (ZOLOFT) 100 MG tablet, Take 1 tablet (100 mg total) by mouth daily., Disp: 90 tablet, Rfl: 1   SUMAtriptan (IMITREX) 50 MG tablet, May repeat in 2 hours if headache persists or recurs., Disp: 10 tablet, Rfl: 0   TRI-LO-MARZIA 0.18/0.215/0.25 MG-25 MCG TABS, TAKE 1 TABLET BY MOUTH EVERY DAY, Disp: 84 tablet, Rfl: 1   Allergies  Allergen Reactions   Grass Extracts [Gramineae Pollens]    Tree Extract      Social History   Tobacco Use   Smoking status: Never   Smokeless tobacco: Never  Vaping Use   Vaping status: Never Used  Substance Use Topics   Alcohol use: No   Drug use: No  Chart Review Today: I personally reviewed active problem list, medication list, allergies, family history, social history, health maintenance, notes from last encounter, lab results, imaging with the patient/caregiver today.   Review of Systems  Constitutional: Negative.   HENT: Negative.    Eyes: Negative.   Respiratory: Negative.    Cardiovascular: Negative.   Gastrointestinal: Negative.   Endocrine: Negative.   Genitourinary: Negative.   Musculoskeletal: Negative.   Skin: Negative.   Allergic/Immunologic: Negative.   Neurological: Negative.   Hematological: Negative.   Psychiatric/Behavioral: Negative.    All other systems reviewed and are  negative.      Objective:   Vitals:   11/18/23 0852  BP: 122/68  Pulse: 70  Resp: 16  SpO2: 98%  Weight: 210 lb (95.3 kg)  Height: 5\' 6"  (1.676 m)    Body mass index is 33.89 kg/m.  Physical Exam Vitals and nursing note reviewed.  Constitutional:      General: She is not in acute distress.    Appearance: Normal appearance. She is well-developed. She is obese. She is not ill-appearing, toxic-appearing or diaphoretic.  HENT:     Head: Normocephalic and atraumatic.     Nose: Nose normal.  Eyes:     General:        Right eye: No discharge.        Left eye: No discharge.     Conjunctiva/sclera: Conjunctivae normal.  Neck:     Trachea: No tracheal deviation.  Cardiovascular:     Rate and Rhythm: Normal rate and regular rhythm.  Pulmonary:     Effort: Pulmonary effort is normal. No respiratory distress.     Breath sounds: No stridor.  Abdominal:     General: Bowel sounds are normal. There is no distension.     Palpations: Abdomen is soft.     Tenderness: There is no abdominal tenderness. There is no guarding or rebound.  Skin:    General: Skin is warm and dry.     Findings: No rash.  Neurological:     Mental Status: She is alert.     Motor: No abnormal muscle tone.     Coordination: Coordination normal.  Psychiatric:        Behavior: Behavior normal.      Results for orders placed or performed in visit on 10/28/23  Vitamin D (25 hydroxy)   Collection Time: 10/28/23  3:42 PM  Result Value Ref Range   Vit D, 25-Hydroxy 39 30 - 100 ng/mL  CBC w/Diff/Platelet   Collection Time: 10/28/23  3:42 PM  Result Value Ref Range   WBC 7.9 3.8 - 10.8 Thousand/uL   RBC 4.98 3.80 - 5.10 Million/uL   Hemoglobin 11.7 11.7 - 15.5 g/dL   HCT 16.1 09.6 - 04.5 %   MCV 75.5 (L) 80.0 - 100.0 fL   MCH 23.5 (L) 27.0 - 33.0 pg   MCHC 31.1 (L) 32.0 - 36.0 g/dL   RDW 40.9 (H) 81.1 - 91.4 %   Platelets 293 140 - 400 Thousand/uL   MPV 9.9 7.5 - 12.5 fL   Neutro Abs 4,361 1,500 -  7,800 cells/uL   Absolute Lymphocytes 2,433 850 - 3,900 cells/uL   Absolute Monocytes 774 200 - 950 cells/uL   Eosinophils Absolute 261 15 - 500 cells/uL   Basophils Absolute 71 0 - 200 cells/uL   Neutrophils Relative % 55.2 %   Total Lymphocyte 30.8 %   Monocytes Relative 9.8 %   Eosinophils Relative 3.3 %  Basophils Relative 0.9 %  COMPLETE METABOLIC PANEL WITH GFR   Collection Time: 10/28/23  3:42 PM  Result Value Ref Range   Glucose, Bld 84 65 - 99 mg/dL   BUN 7 7 - 25 mg/dL   Creat 1.61 0.96 - 0.45 mg/dL   eGFR 409 > OR = 60 WJ/XBJ/4.78G9   BUN/Creatinine Ratio SEE NOTE: 6 - 22 (calc)   Sodium 139 135 - 146 mmol/L   Potassium 4.1 3.5 - 5.3 mmol/L   Chloride 104 98 - 110 mmol/L   CO2 26 20 - 32 mmol/L   Calcium 9.1 8.6 - 10.2 mg/dL   Total Protein 6.8 6.1 - 8.1 g/dL   Albumin 3.9 3.6 - 5.1 g/dL   Globulin 2.9 1.9 - 3.7 g/dL (calc)   AG Ratio 1.3 1.0 - 2.5 (calc)   Total Bilirubin 0.3 0.2 - 1.2 mg/dL   Alkaline phosphatase (APISO) 90 31 - 125 U/L   AST 11 10 - 30 U/L   ALT 9 6 - 29 U/L  Iron, TIBC and Ferritin Panel   Collection Time: 10/28/23  3:42 PM  Result Value Ref Range   Iron 28 (L) 40 - 190 mcg/dL   TIBC 562 130 - 865 mcg/dL (calc)   %SAT 8 (L) 16 - 45 % (calc)   Ferritin 16 16 - 154 ng/mL       Assessment & Plan:   1. Vaginal itching (Primary) & 2. Vaginal odor  - Cervicovaginal ancillary only Pt wishes to wait for results for needed tx, BV vs yeast infection Discussed options metrogel, flagyl PO, trying boric acid tx OTC  3. Medication refill She requests refill on buspar 5 mg which she has been taking BID - refills ordered She was seen in Nov and has f/up in Feb -       Danelle Berry, PA-C 11/18/23 9:03 AM

## 2023-11-19 ENCOUNTER — Encounter: Payer: Self-pay | Admitting: Family Medicine

## 2023-11-19 LAB — CERVICOVAGINAL ANCILLARY ONLY
Bacterial Vaginitis (gardnerella): NEGATIVE
Candida Glabrata: NEGATIVE
Candida Vaginitis: NEGATIVE
Chlamydia: NEGATIVE
Comment: NEGATIVE
Comment: NEGATIVE
Comment: NEGATIVE
Comment: NEGATIVE
Comment: NEGATIVE
Comment: NORMAL
Neisseria Gonorrhea: NEGATIVE
Trichomonas: NEGATIVE

## 2023-11-21 DIAGNOSIS — F32A Depression, unspecified: Secondary | ICD-10-CM | POA: Diagnosis not present

## 2023-11-28 DIAGNOSIS — F32A Depression, unspecified: Secondary | ICD-10-CM | POA: Diagnosis not present

## 2023-11-29 ENCOUNTER — Encounter: Payer: Self-pay | Admitting: Family Medicine

## 2023-11-29 ENCOUNTER — Ambulatory Visit: Payer: Medicaid Other | Admitting: Family Medicine

## 2023-11-29 VITALS — BP 122/68 | HR 72 | Temp 98.0°F | Resp 16 | Ht 66.0 in | Wt 210.0 lb

## 2023-11-29 DIAGNOSIS — J4520 Mild intermittent asthma, uncomplicated: Secondary | ICD-10-CM | POA: Diagnosis not present

## 2023-11-29 DIAGNOSIS — J069 Acute upper respiratory infection, unspecified: Secondary | ICD-10-CM | POA: Diagnosis not present

## 2023-11-29 DIAGNOSIS — R6889 Other general symptoms and signs: Secondary | ICD-10-CM | POA: Diagnosis not present

## 2023-11-29 LAB — POC COVID19/FLU A&B COMBO
Covid Antigen, POC: NEGATIVE
Influenza A Antigen, POC: NEGATIVE
Influenza B Antigen, POC: NEGATIVE

## 2023-11-29 MED ORDER — PREDNISONE 20 MG PO TABS: 40.0000 mg | ORAL_TABLET | Freq: Every day | ORAL | 0 refills | Status: DC

## 2023-11-29 MED ORDER — OSELTAMIVIR PHOSPHATE 75 MG PO CAPS: 75.0000 mg | ORAL_CAPSULE | Freq: Two times a day (BID) | ORAL | 0 refills | Status: DC

## 2023-11-29 NOTE — Progress Notes (Signed)
 Patient ID: MAYSA LYNN, female    DOB: 05/14/2001, 23 y.o.   MRN: [id number]  PCP: Leavy Mole, PA-C  Chief Complaint  Patient presents with   Generalized Body Aches    Started yesterday. Exposed to flu- boyfriend tested positive.   Nasal Congestion    Subjective:   CATRIONA DILLENBECK is a 23 y.o. female, presents to clinic with CC of the following:  HPI   Exposed to flu a few days ago, she just developed nasal/sinus sx, congestin sneezing, Hot and cold subjective fever Currently no cough wheeze CP or SOB She has not used her inhaler, but has hx of asthma  Rapid flu and covid test here negative    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) 09/10/2022   Headache disorder 02/26/2022   Iron deficiency anemia due to chronic blood loss 01/04/2022   Mild persistent asthma without complication 01/04/2022   Anxiety disorder, unspecified 09/20/2021   Allergic rhinitis due to allergen 11/21/2018      Current Outpatient Medications:    albuterol (VENTOLIN HFA) 108 (90 Base) MCG/ACT inhaler, INHALE 2 PUFFS EVERY 4 HOURS AS NEEDED, Disp: , Rfl:    azelastine  (ASTELIN ) 0.1 % nasal spray, PLACE 2 SPRAYS INTO BOTH NOSTRILS 2 (TWO) TIMES DAILY. USE IN EACH NOSTRIL AS DIRECTED, Disp: , Rfl:    budesonide (PULMICORT) 0.5 MG/2ML nebulizer solution, Use one vial twice daily, Disp: , Rfl:    busPIRone  (BUSPAR ) 5 MG tablet, Take 1 tablet (5 mg total) by mouth 2 (two) times daily., Disp: 180 tablet, Rfl: 1   Clindamycin-Benzoyl Per, Refr, gel, PRN, Disp: , Rfl:    Diclofenac  Sodium 3 % GEL, Apply 1 Application topically 2 (two) times daily as needed (to affected area)., Disp: 100 g, Rfl: 0   DULERA 100-5 MCG/ACT AERO, Inhale 2 puffs into the lungs 2 (two) times daily., Disp: , Rfl:    fluticasone  (FLONASE ) 50 MCG/ACT nasal spray, 2 sprays into each nostril daily at 0600.,  Disp: , Rfl:    hydrocortisone 2.5 % cream, , Disp: , Rfl:    ketoconazole (NIZORAL) 2 % shampoo, SMARTSIG:Topical 2-3 Times Weekly, Disp: , Rfl:    levocetirizine (XYZAL ) 5 MG tablet, TAKE 1 TABLET BY MOUTH EVERY DAY IN THE EVENING, Disp: 30 tablet, Rfl: 11   montelukast  (SINGULAIR ) 10 MG tablet, TAKE 1 TABLET BY MOUTH EVERYDAY AT BEDTIME, Disp: 90 tablet, Rfl: 1   nystatin  cream (MYCOSTATIN ), Two (2) times a day (at 8am and 12:00)., Disp: , Rfl:    Olopatadine HCl 0.6 % SOLN, Place 2 sprays into both nostrils 2 (two) times daily., Disp: , Rfl:    Respiratory Therapy Supplies (NEBULIZER/TUBING/MOUTHPIECE) KIT, Disp one nebulizer machine, tubing set and mouthpiece kit, Disp: 1 kit, Rfl: 0   sertraline  (ZOLOFT ) 100 MG tablet, Take 1 tablet (100 mg total) by mouth daily., Disp: 90 tablet, Rfl: 1   SUMAtriptan  (IMITREX ) 50 MG tablet, May repeat in 2 hours if headache persists or recurs., Disp: 10 tablet, Rfl: 0   TRI-LO-MARZIA 0.18/0.215/0.25 MG-25 MCG TABS, TAKE 1 TABLET BY MOUTH EVERY DAY, Disp: 84 tablet, Rfl: 1   Allergies  Allergen Reactions   Grass Extracts [Gramineae Pollens]    Tree Extract      Social History   Tobacco Use   Smoking status: Never   Smokeless tobacco: Never  Vaping Use   Vaping  status: Never Used  Substance Use Topics   Alcohol use: No   Drug use: No      Chart Review Today: I personally reviewed active problem list, medication list, allergies, family history, social history, health maintenance, notes from last encounter, lab results, imaging with the patient/caregiver today.   Review of Systems  Constitutional: Negative.   HENT: Negative.    Eyes: Negative.   Respiratory: Negative.    Cardiovascular: Negative.   Gastrointestinal: Negative.   Endocrine: Negative.   Genitourinary: Negative.   Musculoskeletal: Negative.   Skin: Negative.   Allergic/Immunologic: Negative.   Neurological: Negative.   Hematological: Negative.    Psychiatric/Behavioral: Negative.    All other systems reviewed and are negative.      Objective:   Vitals:   11/29/23 1415  BP: 122/68  Pulse: 72  Resp: 16  Temp: 98 F (36.7 C)  SpO2: 98%  Weight: 210 lb (95.3 kg)  Height: 5' 6 (1.676 m)    Body mass index is 33.89 kg/m.  Physical Exam Vitals and nursing note reviewed.  Constitutional:      General: She is not in acute distress.    Appearance: Normal appearance. She is well-developed. She is not ill-appearing, toxic-appearing or diaphoretic.     Comments: Appears a little tired but well appearing, NAD  HENT:     Head: Normocephalic and atraumatic.     Right Ear: Hearing, tympanic membrane, ear canal and external ear normal. There is no impacted cerumen.     Left Ear: Hearing, tympanic membrane, ear canal and external ear normal. There is no impacted cerumen.     Nose: Mucosal edema, congestion and rhinorrhea present.     Right Sinus: No maxillary sinus tenderness or frontal sinus tenderness.     Left Sinus: No maxillary sinus tenderness or frontal sinus tenderness.     Mouth/Throat:     Mouth: Mucous membranes are moist. Mucous membranes are not pale.     Pharynx: Oropharynx is clear. Uvula midline. Posterior oropharyngeal erythema (very mild) present. No oropharyngeal exudate or uvula swelling.     Tonsils: No tonsillar abscesses.  Eyes:     General: No scleral icterus.       Right eye: No discharge.        Left eye: No discharge.     Conjunctiva/sclera: Conjunctivae normal.  Neck:     Trachea: No tracheal deviation.  Cardiovascular:     Rate and Rhythm: Normal rate and regular rhythm.     Pulses: Normal pulses.     Heart sounds: Normal heart sounds.  Pulmonary:     Effort: Pulmonary effort is normal. No respiratory distress.     Breath sounds: No stridor. No wheezing, rhonchi or rales.  Abdominal:     General: Bowel sounds are normal. There is no distension.     Palpations: Abdomen is soft.   Musculoskeletal:     Cervical back: Normal range of motion and neck supple.  Skin:    General: Skin is warm and dry.     Coloration: Skin is not pale.     Findings: No rash.  Neurological:     Mental Status: She is alert.     Motor: No abnormal muscle tone.     Coordination: Coordination normal.  Psychiatric:        Mood and Affect: Mood normal.        Behavior: Behavior normal.      Results for orders placed or performed in visit  on 11/18/23  Cervicovaginal ancillary only   Collection Time: 11/18/23  8:56 AM  Result Value Ref Range   Neisseria Gonorrhea Negative    Chlamydia Negative    Trichomonas Negative    Bacterial Vaginitis (gardnerella) Negative    Candida Vaginitis Negative    Candida Glabrata Negative    Comment      Normal Reference Range Bacterial Vaginosis - Negative   Comment Normal Reference Range Candida Species - Negative    Comment Normal Reference Range Candida Galbrata - Negative    Comment Normal Reference Range Trichomonas - Negative    Comment Normal Reference Ranger Chlamydia - Negative    Comment      Normal Reference Range Neisseria Gonorrhea - Negative       Assessment & Plan:   1. Flu-like symptoms (Primary) Testing here today neg With her sx just starting encouraged her to get home test and retest tomorrow - if positive for flu she was encouraged to start tamiflu  Otherwise likely a different viral URI and supportive and sx measures were reviewed at length with her today - POC Covid19/Flu A&B Antigen - oseltamivir  (TAMIFLU ) 75 MG capsule; Take 1 capsule (75 mg total) by mouth 2 (two) times daily for 5 days.  Dispense: 10 capsule; Refill: 0  2. Mild intermittent asthma, unspecified whether complicated Currently not exacerbated but with weekend she was encouraged to start steroids if she started to have asthma exacerbation or need for inhaler frequently - predniSONE  (DELTASONE ) 20 MG tablet; Take 2 tablets (40 mg total) by mouth daily with  breakfast for 5 days.  Dispense: 10 tablet; Refill: 0  3. Upper respiratory tract infection, unspecified type See above  Encouraged pushing fluids and resting, staying on her allergy medications and no sprays, she can use over-the-counter decongestants like Sudafed Offered her a work note but she declined      Michelene Cower, PA-C 11/29/23 2:24 PM

## 2023-12-04 ENCOUNTER — Ambulatory Visit: Payer: Medicaid Other | Admitting: Family Medicine

## 2023-12-04 ENCOUNTER — Encounter: Payer: Self-pay | Admitting: Family Medicine

## 2023-12-04 VITALS — BP 114/74 | HR 95 | Temp 98.3°F | Resp 16 | Ht 66.0 in | Wt 207.2 lb

## 2023-12-04 DIAGNOSIS — J3089 Other allergic rhinitis: Secondary | ICD-10-CM

## 2023-12-04 DIAGNOSIS — F331 Major depressive disorder, recurrent, moderate: Secondary | ICD-10-CM | POA: Diagnosis not present

## 2023-12-04 DIAGNOSIS — K219 Gastro-esophageal reflux disease without esophagitis: Secondary | ICD-10-CM | POA: Diagnosis not present

## 2023-12-04 DIAGNOSIS — J4530 Mild persistent asthma, uncomplicated: Secondary | ICD-10-CM | POA: Diagnosis not present

## 2023-12-04 DIAGNOSIS — H6122 Impacted cerumen, left ear: Secondary | ICD-10-CM | POA: Diagnosis not present

## 2023-12-04 DIAGNOSIS — F419 Anxiety disorder, unspecified: Secondary | ICD-10-CM

## 2023-12-04 DIAGNOSIS — H9193 Unspecified hearing loss, bilateral: Secondary | ICD-10-CM

## 2023-12-04 MED ORDER — FAMOTIDINE 40 MG PO TABS: 40.0000 mg | ORAL_TABLET | Freq: Every day | ORAL | 0 refills | Status: AC

## 2023-12-04 NOTE — Progress Notes (Signed)
Name: Melinda Snyder   MRN: [id number]    DOB: Dec 22, 2000   Date:12/04/2023       Progress Note  Subjective  Chief Complaint  Chief Complaint  Patient presents with   Hearing Problem    Feels like having hear loss on both ears but more from L ear   Discussed the use of AI scribe software for clinical note transcription with the patient, who gave verbal consent to proceed.  History of Present Illness   Melinda M Scatena "Jackqulyn Livings" is a 23 year old female with asthma and GERD who presents with ear fullness and hearing concerns.  She experiences ear fullness and intermittent hearing issues, primarily on the left side, describing the sensation as 'clogged' or 'like water has gotten in it.' This sensation sometimes improves when she shakes her head or leans to the left, but it has persisted this time. She also reports ringing in the left ear for years, initially attributing it to ear clogging.  She has a history of asthma, which flared up last week due to a viral illness. She was treated with Tamiflu and a short course of prednisone, which she believes was the same dose for five days. Her breathing has improved, and she reports no current asthma symptoms or cough. She uses several medications for asthma and allergies, including  azelastine nasal spray, Pulmicort for her nebulizer, fluticasone nasal spray, and Dulera inhaler. She uses Pulmicort as needed, particularly during asthma attacks or before lying down.  She has a history of gastroesophageal reflux disease (GERD) and manages it with over-the-counter dissolvable tablets like Tums, which she takes as needed. She is interested in having a prescription to take prn, particularly when consuming spicy foods or caffeine.  She has a history of major depression, which worsened at the end of last year. She has been in therapy since September of the previous year and finds it helpful. She is currently taking sertraline 100 mg and Buspar as needed for anxiety.  She reports improvement but acknowledges she is not yet back to her normal self, however she does not want to change or adjust medication dose at this time  She has a past history of migraines but reports that they have resolved and she no longer requires Imitrex.        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) 09/10/2022   Headache disorder 02/26/2022   Iron deficiency anemia due to chronic blood loss 01/04/2022   Mild persistent asthma without complication 01/04/2022   Anxiety disorder, unspecified 09/20/2021   Allergic rhinitis due to allergen 11/21/2018    Past Surgical History:  Procedure Laterality Date   NO PAST SURGERIES      Family History  Problem Relation Age of Onset   Hypertension Mother    Iron deficiency Mother    GER disease Mother    Heart disease Father    Kidney disease Father     Social History   Tobacco Use   Smoking status: Never   Smokeless tobacco: Never  Substance Use Topics   Alcohol use: No     Current Outpatient Medications:    azelastine (ASTELIN) 0.1 % nasal spray, PLACE 2 SPRAYS INTO BOTH NOSTRILS 2 (TWO) TIMES DAILY. USE IN EACH NOSTRIL AS DIRECTED, Disp: , Rfl:    budesonide (PULMICORT) 0.5 MG/2ML nebulizer solution, Use one vial twice daily, Disp: , Rfl:  busPIRone (BUSPAR) 5 MG tablet, Take 1 tablet (5 mg total) by mouth 2 (two) times daily., Disp: 180 tablet, Rfl: 1   Clindamycin-Benzoyl Per, Refr, gel, PRN, Disp: , Rfl:    Diclofenac Sodium 3 % GEL, Apply 1 Application topically 2 (two) times daily as needed (to affected area)., Disp: 100 g, Rfl: 0   DULERA 100-5 MCG/ACT AERO, Inhale 2 puffs into the lungs 2 (two) times daily., Disp: , Rfl:    fluticasone (FLONASE) 50 MCG/ACT nasal spray, 2 sprays into each nostril daily at 0600., Disp: , Rfl:    hydrocortisone 2.5 % cream, , Disp: , Rfl:    ketoconazole (NIZORAL)  2 % shampoo, SMARTSIG:Topical 2-3 Times Weekly, Disp: , Rfl:    levocetirizine (XYZAL) 5 MG tablet, TAKE 1 TABLET BY MOUTH EVERY DAY IN THE EVENING, Disp: 30 tablet, Rfl: 11   montelukast (SINGULAIR) 10 MG tablet, TAKE 1 TABLET BY MOUTH EVERYDAY AT BEDTIME, Disp: 90 tablet, Rfl: 1   nystatin cream (MYCOSTATIN), Two (2) times a day (at 8am and 12:00)., Disp: , Rfl:    Olopatadine HCl 0.6 % SOLN, Place 2 sprays into both nostrils 2 (two) times daily., Disp: , Rfl:    oseltamivir (TAMIFLU) 75 MG capsule, Take 1 capsule (75 mg total) by mouth 2 (two) times daily for 5 days., Disp: 10 capsule, Rfl: 0   predniSONE (DELTASONE) 20 MG tablet, Take 2 tablets (40 mg total) by mouth daily with breakfast for 5 days., Disp: 10 tablet, Rfl: 0   Respiratory Therapy Supplies (NEBULIZER/TUBING/MOUTHPIECE) KIT, Disp one nebulizer machine, tubing set and mouthpiece kit, Disp: 1 kit, Rfl: 0   sertraline (ZOLOFT) 100 MG tablet, Take 1 tablet (100 mg total) by mouth daily., Disp: 90 tablet, Rfl: 1   SODIUM FLUORIDE 5000 SENSITIVE 1.1-5 % GEL, PLEASE SEE ATTACHED FOR DETAILED DIRECTIONS, Disp: , Rfl:    SUMAtriptan (IMITREX) 50 MG tablet, May repeat in 2 hours if headache persists or recurs., Disp: 10 tablet, Rfl: 0   TRI-LO-MARZIA 0.18/0.215/0.25 MG-25 MCG TABS, TAKE 1 TABLET BY MOUTH EVERY DAY, Disp: 84 tablet, Rfl: 1  Allergies  Allergen Reactions   Grass Extracts [Gramineae Pollens]    Tree Extract     I personally reviewed active problem list, medication list, allergies, family history with the patient/caregiver today.   ROS  Ten systems reviewed and is negative except as mentioned in HPI    Objective  Vitals:   12/04/23 1401  BP: 114/74  Pulse: 95  Resp: 16  Temp: 98.3 F (36.8 C)  TempSrc: Oral  SpO2: 97%  Weight: 207 lb 3.2 oz (94 kg)  Height: 5\' 6"  (1.676 m)    Body mass index is 33.44 kg/m.  Physical Exam  Constitutional: Patient appears well-developed and well-nourished. Obese  No  distress.  HEENT: head atraumatic, normocephalic, pupils equal and reactive to light, ears initially had cerumen impaction worse on left side, after ear lavage normal TM, neck supple Cardiovascular: Normal rate, regular rhythm and normal heart sounds.  No murmur heard. No BLE edema. Pulmonary/Chest: Effort normal and breath sounds normal. No respiratory distress. Abdominal: Soft.  There is no tenderness. Psychiatric: Patient has a normal mood and affect. behavior is normal. Judgment and thought content normal.   Recent Results (from the past 2160 hours)  Vitamin D (25 hydroxy)     Status: None   Collection Time: 10/28/23  3:42 PM  Result Value Ref Range   Vit D, 25-Hydroxy 39 30 - 100 ng/mL  Comment: Vitamin D Status         25-OH Vitamin D: . Deficiency:                    <20 ng/mL Insufficiency:             20 - 29 ng/mL Optimal:                 > or = 30 ng/mL . For 25-OH Vitamin D testing on patients on  D2-supplementation and patients for whom quantitation  of D2 and D3 fractions is required, the [facility](TM) 25-OH VIT D, (D2,D3), LC/MS/MS is recommended: order  code 96045 (patients >39yrs). . See Note 1 . Note 1 . For additional information, please refer to  http://education.[facility].com/faq/FAQ199  (This link is being provided for informational/ educational purposes only.)   CBC w/Diff/Platelet     Status: Abnormal   Collection Time: 10/28/23  3:42 PM  Result Value Ref Range   WBC 7.9 3.8 - 10.8 Thousand/uL   RBC 4.98 3.80 - 5.10 Million/uL   Hemoglobin 11.7 11.7 - 15.5 g/dL   HCT 40.9 81.1 - 91.4 %   MCV 75.5 (L) 80.0 - 100.0 fL   MCH 23.5 (L) 27.0 - 33.0 pg   MCHC 31.1 (L) 32.0 - 36.0 g/dL    Comment: For adults, a slight decrease in the calculated MCHC value (in the range of 30 to 32 g/dL) is most likely not clinically significant; however, it should be interpreted with caution in correlation with other red cell parameters and the patient's  clinical condition.    RDW 15.4 (H) 11.0 - 15.0 %   Platelets 293 140 - 400 Thousand/uL   MPV 9.9 7.5 - 12.5 fL   Neutro Abs 4,361 1,500 - 7,800 cells/uL   Absolute Lymphocytes 2,433 850 - 3,900 cells/uL   Absolute Monocytes 774 200 - 950 cells/uL   Eosinophils Absolute 261 15 - 500 cells/uL   Basophils Absolute 71 0 - 200 cells/uL   Neutrophils Relative % 55.2 %   Total Lymphocyte 30.8 %   Monocytes Relative 9.8 %   Eosinophils Relative 3.3 %   Basophils Relative 0.9 %  COMPLETE METABOLIC PANEL WITH GFR     Status: None   Collection Time: 10/28/23  3:42 PM  Result Value Ref Range   Glucose, Bld 84 65 - 99 mg/dL    Comment: .            Fasting reference interval .    BUN 7 7 - 25 mg/dL   Creat 7.82 9.56 - 2.13 mg/dL   eGFR 086 > OR = 60 VH/QIO/9.62X5   BUN/Creatinine Ratio SEE NOTE: 6 - 22 (calc)    Comment:    Not Reported: BUN and Creatinine are within    reference range. .    Sodium 139 135 - 146 mmol/L   Potassium 4.1 3.5 - 5.3 mmol/L   Chloride 104 98 - 110 mmol/L   CO2 26 20 - 32 mmol/L   Calcium 9.1 8.6 - 10.2 mg/dL   Total Protein 6.8 6.1 - 8.1 g/dL   Albumin 3.9 3.6 - 5.1 g/dL   Globulin 2.9 1.9 - 3.7 g/dL (calc)   AG Ratio 1.3 1.0 - 2.5 (calc)   Total Bilirubin 0.3 0.2 - 1.2 mg/dL   Alkaline phosphatase (APISO) 90 31 - 125 U/L   AST 11 10 - 30 U/L   ALT 9 6 - 29 U/L  Iron, TIBC and  Ferritin Panel     Status: Abnormal   Collection Time: 10/28/23  3:42 PM  Result Value Ref Range   Iron 28 (L) 40 - 190 mcg/dL   TIBC 161 096 - 045 mcg/dL (calc)   %SAT 8 (L) 16 - 45 % (calc)   Ferritin 16 16 - 154 ng/mL  Cervicovaginal ancillary only     Status: None   Collection Time: 11/18/23  8:56 AM  Result Value Ref Range   Neisseria Gonorrhea Negative    Chlamydia Negative    Trichomonas Negative    Bacterial Vaginitis (gardnerella) Negative    Candida Vaginitis Negative    Candida Glabrata Negative    Comment      Normal Reference Range Bacterial Vaginosis -  Negative   Comment Normal Reference Range Candida Species - Negative    Comment Normal Reference Range Candida Galbrata - Negative    Comment Normal Reference Range Trichomonas - Negative    Comment Normal Reference Ranger Chlamydia - Negative    Comment      Normal Reference Range Neisseria Gonorrhea - Negative  POC Covid19/Flu A&B Antigen     Status: None   Collection Time: 11/29/23  2:30 PM  Result Value Ref Range   Influenza A Antigen, POC Negative Negative   Influenza B Antigen, POC Negative Negative   Covid Antigen, POC Negative Negative    Diabetic Foot Exam:     PHQ2/9:    12/04/2023    2:02 PM 09/04/2023   11:05 AM 07/24/2023   11:29 AM 06/25/2023   10:19 AM 03/26/2023    3:20 PM  Depression screen PHQ 2/9  Decreased Interest 1 1 3 3 1   Down, Depressed, Hopeless 1 1 3 3 1   PHQ - 2 Score 2 2 6 6 2   Altered sleeping 1 1 3 3 2   Tired, decreased energy 1 1 3 3 2   Change in appetite 1 1 3 3 2   Feeling bad or failure about yourself  0 1 3 3 2   Trouble concentrating 1 1 3 3 2   Moving slowly or fidgety/restless 0 0 0 0 0  Suicidal thoughts 0 0 0 0 0  PHQ-9 Score 6 7 21 21 12   Difficult doing work/chores Somewhat difficult Somewhat difficult Very difficult Very difficult Very difficult    phq 9 is positive  Fall Risk:    12/04/2023    1:56 PM 09/04/2023   11:02 AM 07/24/2023   11:28 AM 06/25/2023   10:18 AM 03/26/2023    3:20 PM  Fall Risk   Falls in the past year? 0 0 0 0 0  Number falls in past yr: 0 0 0 0 0  Injury with Fall? 0 0 0 0 0  Risk for fall due to : No Fall Risks No Fall Risks No Fall Risks No Fall Risks No Fall Risks  Follow up Falls prevention discussed;Education provided;Falls evaluation completed Falls prevention discussed;Education provided;Falls evaluation completed Falls prevention discussed;Education provided;Falls evaluation completed Falls prevention discussed;Education provided;Falls evaluation completed Falls prevention discussed;Education  provided;Falls evaluation completed     Assessment and Plan    Asthma mild Persistent Recent flare resolved with short course of prednisone. Patient uses Pulmicort as needed and Dulera for maintenance. No current symptoms. -Continue current asthma management plan.  Perennial Allergic Rhinitis Chronic condition managed by allergist. Currently experiencing mild congestion. -Continue current management plan.  Cerumen Impaction Left ear feeling clogged, possibly due to wax build-up. -Perform ear lavage today.  Tinnitus Patient  reports long-standing ringing in left ear. -Refer to Ear, Nose, and Throat specialist for further evaluation.  Gastroesophageal Reflux Disease (GERD) Managed with over-the-counter medication as needed. -Prescribe Pepcid to be taken as needed.  Major Depression Managed with sertraline and Buspar as needed. Patient is currently in therapy and reports improvement. -Continue current management plan.  Follow-up in 3 months with Leisa.     Procedure:  Verbal consent given Possible side effects discussed with patient Ears were  lavaged with warm water and peroxide  Patient tolerated procedure well No complications

## 2023-12-05 ENCOUNTER — Ambulatory Visit: Payer: Self-pay | Admitting: Family Medicine

## 2023-12-05 DIAGNOSIS — F32A Depression, unspecified: Secondary | ICD-10-CM | POA: Diagnosis not present

## 2023-12-19 DIAGNOSIS — F32A Depression, unspecified: Secondary | ICD-10-CM | POA: Diagnosis not present

## 2023-12-26 DIAGNOSIS — F32A Depression, unspecified: Secondary | ICD-10-CM | POA: Diagnosis not present

## 2024-01-02 DIAGNOSIS — F32A Depression, unspecified: Secondary | ICD-10-CM | POA: Diagnosis not present

## 2024-01-09 DIAGNOSIS — F32A Depression, unspecified: Secondary | ICD-10-CM | POA: Diagnosis not present

## 2024-01-16 DIAGNOSIS — F32A Depression, unspecified: Secondary | ICD-10-CM | POA: Diagnosis not present

## 2024-01-29 ENCOUNTER — Other Ambulatory Visit: Payer: Self-pay | Admitting: Physician Assistant

## 2024-01-29 DIAGNOSIS — F419 Anxiety disorder, unspecified: Secondary | ICD-10-CM

## 2024-01-29 DIAGNOSIS — F331 Major depressive disorder, recurrent, moderate: Secondary | ICD-10-CM

## 2024-01-30 DIAGNOSIS — F32A Depression, unspecified: Secondary | ICD-10-CM | POA: Diagnosis not present

## 2024-02-06 DIAGNOSIS — F32A Depression, unspecified: Secondary | ICD-10-CM | POA: Diagnosis not present

## 2024-02-27 DIAGNOSIS — F32A Depression, unspecified: Secondary | ICD-10-CM | POA: Diagnosis not present

## 2024-03-05 DIAGNOSIS — F32A Depression, unspecified: Secondary | ICD-10-CM | POA: Diagnosis not present

## 2024-03-06 ENCOUNTER — Ambulatory Visit: Payer: Medicaid Other | Admitting: Family Medicine

## 2024-03-26 DIAGNOSIS — F32A Depression, unspecified: Secondary | ICD-10-CM | POA: Diagnosis not present

## 2024-04-02 ENCOUNTER — Other Ambulatory Visit: Payer: Self-pay | Admitting: Family Medicine

## 2024-04-02 DIAGNOSIS — Z3041 Encounter for surveillance of contraceptive pills: Secondary | ICD-10-CM

## 2024-04-02 DIAGNOSIS — F32A Depression, unspecified: Secondary | ICD-10-CM | POA: Diagnosis not present

## 2024-04-02 NOTE — Telephone Encounter (Signed)
 Requested Prescriptions  Pending Prescriptions Disp Refills   Norgestimate-Eth Estradiol (TRI-LO-MARZIA) 0.18/0.215/0.25 MG-25 MCG TABS [Pharmacy Med Name: TRI-LO-MARZIA TABLET] 84 tablet 0    Sig: TAKE 1 TABLET BY MOUTH EVERY DAY     OB/GYN:  Contraceptives Passed - 04/02/2024  1:41 PM      Passed - Last BP in normal range    BP Readings from Last 1 Encounters:  12/04/23 114/74         Passed - Valid encounter within last 12 months    Recent Outpatient Visits           4 months ago Bilateral hearing loss, unspecified hearing loss type   [facility] Arleen Lacer, MD   4 months ago Flu-like symptoms   [facility] Adeline Hone, PA-C              Passed - Patient is not a smoker

## 2024-04-16 DIAGNOSIS — N3001 Acute cystitis with hematuria: Secondary | ICD-10-CM | POA: Diagnosis not present

## 2024-04-16 DIAGNOSIS — R350 Frequency of micturition: Secondary | ICD-10-CM | POA: Diagnosis not present

## 2024-06-14 ENCOUNTER — Other Ambulatory Visit: Payer: Self-pay | Admitting: Family Medicine

## 2024-06-15 NOTE — Telephone Encounter (Signed)
 Requested Prescriptions  Pending Prescriptions Disp Refills   busPIRone  (BUSPAR ) 5 MG tablet [Pharmacy Med Name: BUSPIRONE  HCL 5 MG TABLET] 180 tablet 1    Sig: TAKE 1 TABLET BY MOUTH TWICE A DAY     Psychiatry: Anxiolytics/Hypnotics - Non-controlled Passed - 06/15/2024  5:09 PM      Passed - Valid encounter within last 12 months    Recent Outpatient Visits           6 months ago Bilateral hearing loss, unspecified hearing loss type   [facility] Glenard Mire, MD   6 months ago Flu-like symptoms   [facility] Leavy Mole, PA-C

## 2024-07-06 ENCOUNTER — Other Ambulatory Visit: Payer: Self-pay | Admitting: Family Medicine

## 2024-07-06 DIAGNOSIS — Z3041 Encounter for surveillance of contraceptive pills: Secondary | ICD-10-CM

## 2024-07-07 NOTE — Telephone Encounter (Signed)
 Requested Prescriptions  Pending Prescriptions Disp Refills   TRI-LO-MARZIA 0.18/0.215/0.25 MG-25 MCG TABS [Pharmacy Med Name: TRI-LO-MARZIA TABLET] 84 tablet 0    Sig: TAKE 1 TABLET BY MOUTH EVERY DAY     OB/GYN:  Contraceptives Passed - 07/07/2024 12:17 PM      Passed - Last BP in normal range    BP Readings from Last 1 Encounters:  12/04/23 114/74         Passed - Valid encounter within last 12 months    Recent Outpatient Visits           7 months ago Bilateral hearing loss, unspecified hearing loss type   [facility] Glenard Mire, MD   7 months ago Flu-like symptoms   [facility] Leavy Mole, PA-C              Passed - Patient is not a smoker

## 2024-07-08 ENCOUNTER — Ambulatory Visit (INDEPENDENT_AMBULATORY_CARE_PROVIDER_SITE_OTHER): Payer: Self-pay | Admitting: Family Medicine

## 2024-07-08 VITALS — BP 110/70 | HR 92 | Temp 98.5°F | Resp 16 | Ht 66.0 in | Wt 164.0 lb

## 2024-07-08 DIAGNOSIS — R55 Syncope and collapse: Secondary | ICD-10-CM

## 2024-07-08 DIAGNOSIS — J029 Acute pharyngitis, unspecified: Secondary | ICD-10-CM

## 2024-07-08 DIAGNOSIS — U071 COVID-19: Secondary | ICD-10-CM

## 2024-07-08 DIAGNOSIS — J069 Acute upper respiratory infection, unspecified: Secondary | ICD-10-CM

## 2024-07-08 DIAGNOSIS — R509 Fever, unspecified: Secondary | ICD-10-CM

## 2024-07-08 DIAGNOSIS — J4521 Mild intermittent asthma with (acute) exacerbation: Secondary | ICD-10-CM

## 2024-07-08 DIAGNOSIS — R42 Dizziness and giddiness: Secondary | ICD-10-CM

## 2024-07-08 LAB — POC COVID19/FLU A&B COMBO
Covid Antigen, POC: POSITIVE — AB
Influenza A Antigen, POC: NEGATIVE
Influenza B Antigen, POC: NEGATIVE

## 2024-07-08 LAB — POCT RAPID STREP A (OFFICE): Rapid Strep A Screen: NEGATIVE

## 2024-07-08 LAB — GLUCOSE, POCT (MANUAL RESULT ENTRY): POC Glucose: 93 mg/dL (ref 70–99)

## 2024-07-08 NOTE — Progress Notes (Signed)
 Patient ID: Melinda Snyder, female    DOB: 09/30/2001, 22 y.o.   MRN: [id number]  PCP: Leavy Mole, PA-C  Chief Complaint  Patient presents with   Sore Throat    X1 month   Cough    Productive- mucus and blood    Subjective:   Melinda Snyder is a 23 y.o. female, presents to clinic with CC of the following:  HPI  Sore throat then loss of voice, raspy, then cough over the past couple weeks, URI sx Results for orders placed or performed in visit on 07/08/24  POCT rapid strep A   Collection Time: 07/08/24 10:08 AM  Result Value Ref Range   Rapid Strep A Screen Negative Negative  Fever 2 d ago - subjective fever laid in bed feeling so unwell.  Discussed the use of AI scribe software for clinical note transcription with the patient, who gave verbal consent to proceed.  History of Present Illness Melinda Snyder is a 23 year old female with asthma who presents with persistent sore throat, voice changes, and respiratory symptoms.  Oropharyngeal symptoms - Sore throat began on August 7th, initially accompanied by Mcgill[place]ray patches in the posterior pharynx - Sore throat resolved, but subsequently developed hoarseness and complete aphonia for 1.5 weeks - Persistent sensation of throat scratchiness - Hemoptysis characterized by red spots in sputum or mucus, also some blood when she blows her nose - Occasional epistaxis - No significant otalgia, but presence of tinnitus - No facial pain or pressure  Upper respiratory symptoms - Congestion and rhinorrhea present - Fever onset two days prior to visit, not quantified but subjectively high - Associated symptoms include headache, body aches, dizziness, and lightheadedness - No significant ear pain  Respiratory symptoms and asthma - Difficulty breathing, particularly in the back - Inability to inhale fully with sensation of chest tightness - History of asthma - Current asthma regimen includes Dulera, nightly nebulizer, and  occasional Pulmicort - Nonadherence to prescribed asthma treatments - dulera per pt and pulm can be used as a rescue inhaler  Constitutional symptoms and oral intake - Dizziness and lightheadedness, especially after sitting on the exam table - No loss of consciousness - Poor oral intake due to illness, with minimal eating and drinking - Attempting to maintain hydration with fluids  Medication use - Use of over-the-counter medications including Benadryl Congestion Plus, Cold-EEZE, and dissolvable allergy tablets - Mother provided some of the allergy medications       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) 09/10/2022   Headache disorder 02/26/2022   Iron deficiency anemia due to chronic blood loss 01/04/2022   Mild persistent asthma without complication 01/04/2022   Anxiety disorder, unspecified 09/20/2021   Allergic rhinitis due to allergen 11/21/2018      Current Outpatient Medications:    budesonide (PULMICORT) 0.5 MG/2ML nebulizer solution, Use one vial twice daily, Disp: , Rfl:    busPIRone  (BUSPAR ) 5 MG tablet, TAKE 1 TABLET BY MOUTH TWICE A DAY, Disp: 180 tablet, Rfl: 1   Clindamycin-Benzoyl Per, Refr, gel, PRN, Disp: , Rfl:    Diclofenac  Sodium 3 % GEL, Apply 1 Application topically 2 (two) times daily as needed (to affected area)., Disp: 100 g, Rfl: 0   DULERA 100-5 MCG/ACT AERO, Inhale 2 puffs into the lungs 2 (two) times daily., Disp: , Rfl:    famotidine  (PEPCID ) 40  MG tablet, Take 1 tablet (40 mg total) by mouth daily., Disp: 30 tablet, Rfl: 0   hydrocortisone 2.5 % cream, , Disp: , Rfl:    ketoconazole (NIZORAL) 2 % shampoo, SMARTSIG:Topical 2-3 Times Weekly, Disp: , Rfl:    levocetirizine (XYZAL ) 5 MG tablet, TAKE 1 TABLET BY MOUTH EVERY DAY IN THE EVENING, Disp: 30 tablet, Rfl: 11   montelukast  (SINGULAIR ) 10 MG tablet, TAKE 1 TABLET BY MOUTH  EVERYDAY AT BEDTIME, Disp: 90 tablet, Rfl: 1   nystatin  cream (MYCOSTATIN ), Two (2) times a day (at 8am and 12:00)., Disp: , Rfl:    sertraline  (ZOLOFT ) 100 MG tablet, Take 1 tablet (100 mg total) by mouth daily., Disp: 90 tablet, Rfl: 1   SODIUM FLUORIDE  5000 SENSITIVE 1.1-5 % GEL, PLEASE SEE ATTACHED FOR DETAILED DIRECTIONS, Disp: , Rfl:    SUMAtriptan  (IMITREX ) 50 MG tablet, May repeat in 2 hours if headache persists or recurs., Disp: 10 tablet, Rfl: 0   TRI-LO-MARZIA 0.18/0.215/0.25 MG-25 MCG TABS, TAKE 1 TABLET BY MOUTH EVERY DAY, Disp: 84 tablet, Rfl: 0   Allergies  Allergen Reactions   Grass Extracts [Gramineae Pollens]    Tree Extract      Social History   Tobacco Use   Smoking status: Never   Smokeless tobacco: Never  Vaping Use   Vaping status: Never Used  Substance Use Topics   Alcohol use: No   Drug use: No      Chart Review Today: I personally reviewed active problem list, medication list, allergies, family history, social history, health maintenance, notes from last encounter, lab results, imaging with the patient/caregiver today.   Review of Systems  All other systems reviewed and are negative.      Objective:   Vitals:   07/08/24 1000  BP: 128/70  Pulse: 79  Resp: 16  Temp: 98.5 F (36.9 C)  SpO2: 97%  Weight: 164 lb (74.4 kg)  Height: 5' 6 (1.676 m)    Body mass index is 26.47 kg/m.  Physical Exam Vitals and nursing note reviewed.  Constitutional:      General: She is not in acute distress.    Appearance: She is well-developed. She is not ill-appearing, toxic-appearing or diaphoretic.  HENT:     Head: Normocephalic and atraumatic.     Right Ear: Hearing, tympanic membrane, ear canal and external ear normal. There is no impacted cerumen.     Left Ear: Hearing, tympanic membrane, ear canal and external ear normal. There is no impacted cerumen.     Nose: Mucosal edema and rhinorrhea present. No congestion. Rhinorrhea is clear.     Right  Nostril: No epistaxis.     Left Nostril: No epistaxis.     Right Turbinates: Swollen.     Left Turbinates: Swollen.     Right Sinus: No maxillary sinus tenderness or frontal sinus tenderness.     Left Sinus: No maxillary sinus tenderness or frontal sinus tenderness.     Mouth/Throat:     Lips: Pink. No lesions.     Mouth: Mucous membranes are moist. Mucous membranes are not pale.     Tongue: No lesions.     Pharynx: Oropharynx is clear. Uvula midline. Posterior oropharyngeal erythema present. No pharyngeal swelling, oropharyngeal exudate, uvula swelling or postnasal drip.     Tonsils: No tonsillar exudate or tonsillar abscesses. 1+ on the right. 1+ on the left.  Eyes:     General:        Right eye: No discharge.  Left eye: No discharge.     Conjunctiva/sclera: Conjunctivae normal.     Pupils: Pupils are equal, round, and reactive to light.  Neck:     Trachea: No tracheal deviation.  Cardiovascular:     Rate and Rhythm: Normal rate and regular rhythm.     Pulses: Normal pulses.     Heart sounds: Normal heart sounds. No murmur heard.    No friction rub. No gallop.  Pulmonary:     Effort: Pulmonary effort is normal. No tachypnea, accessory muscle usage, respiratory distress or retractions.     Breath sounds: Normal breath sounds. No stridor, decreased air movement or transmitted upper airway sounds. No decreased breath sounds, wheezing, rhonchi or rales.  Abdominal:     General: Bowel sounds are normal. There is no distension.     Palpations: Abdomen is soft.  Musculoskeletal:        General: Normal range of motion.     Cervical back: Normal range of motion and neck supple.  Skin:    General: Skin is warm and dry.     Coloration: Skin is not pale.     Findings: No rash.  Neurological:     Mental Status: She is alert.     Motor: No abnormal muscle tone.     Coordination: Coordination normal.  Psychiatric:        Behavior: Behavior normal.      Results for orders  placed or performed in visit on 07/08/24  POCT rapid strep A   Collection Time: 07/08/24 10:08 AM  Result Value Ref Range   Rapid Strep A Screen Negative Negative  POC Covid19/Flu A&B Antigen   Collection Time: 07/08/24 10:32 AM  Result Value Ref Range   Influenza A Antigen, POC Negative Negative   Influenza B Antigen, POC Negative Negative   Covid Antigen, POC Positive (A) Negative  POCT Glucose (CBG)   Collection Time: 07/08/24 11:14 AM  Result Value Ref Range   POC Glucose 93 70 - 99 mg/dl       Assessment & Plan:    Assessment & Plan  1. Fever, unspecified fever cause Onset 2 d ago - see below  2. Sore throat Pharyngitis off and on for 3 weeks, some [place] patches shown to be on photo, none today, strep neg pharyngitis with sore throat, redness, and irritation. Negative rapid strep test. Symptoms likely due to viral infection, possibly related to COVID-19. - Symptomatic treatment with saline nasal sprays/OTC analgesics to moisturize and reduce irritation. - POCT rapid strep A  3. Upper respiratory tract infection, unspecified type See below  4. Upper respiratory tract infection due to COVID-19 virus (Primary) COVID-19 infection Confirmed COVID-19 infection with symptoms of sore throat, fever, body aches, and upper respiratory symptoms. Symptoms began 2 days recent fever/URI sx fatigue, malaise  but other URI sore throat sx on and off for a few weeks? .  - discussed Paxlovid  Discussed potential benefits in preventing severe cases and hospitalization. She would need recent labs - she told CMA she did not want labs or meds prior to leaving - offered to do labs, prescription for Paxlovid to pharmacy, write work note and she left declining everything once she stopped feeling lightheaded  - Advise to stay home until 24 hours without fever or symptoms. - Recommend wearing a mask around others once feeling better. - Discuss potential cost issues with Paxlovid and offer  assistance with insurance issues if needed. - POC Covid19/Flu A&B Antigen  5. Mild  intermittent asthma with acute exacerbation Asthma with mile acute exacerbation Asthma exacerbation with symptoms of difficulty inhaling fully and chest tightness. Not using breathing treatments as prescribed. Discussed use of albuterol as a rescue inhaler to relieve airway tightness and spasms. Consideration of steroids if symptoms persist or worsen. - Use albuterol nebulizer as needed for relief of airway tightness. - Consider oral steroids if symptoms do not improve. On exam lung CTA A&P no wheeze, no increased work of breathing, pt only had increased RR when she felt dizzy and laid down, besides tachypnea there was no other increased work of breathing or distress, no wheeze, stridor, coughing, able to speak in full sentences she did not want a neb tx - we discussed and thought thay may make her feel worse  6. Postural dizziness with near syncope After pt suddenly felt like she was going to pass out she became tachypneic RR 36 - orthostatics neg Pt able to sit up and drink some water Poc glucose - 93 Results for orders placed or performed in visit on 07/08/24  POCT rapid strep A   Collection Time: 07/08/24 10:08 AM  Result Value Ref Range   Rapid Strep A Screen Negative Negative  POC Covid19/Flu A&B Antigen   Collection Time: 07/08/24 10:32 AM  Result Value Ref Range   Influenza A Antigen, POC Negative Negative   Influenza B Antigen, POC Negative Negative   Covid Antigen, POC Positive (A) Negative   Lightheadedness and dizziness possibly due to dehydration and viral illness. Symptoms include dizziness when sitting up and laying back on the exam table. Vital signs stable but symptomatic when changing positions. - Encourage oral hydration. - Consider ER visit if symptoms persist or worsen. - Comprehensive metabolic panel with GFR - CBC with Differential/Platelet - POCT Glucose (CBG)  When pt felt  dizzy/lightheaded and she laid down BP was 100/66, HR 67-75, pulse Ox 99%, RR 36  Let pt rest, she later wanted to sit up and drink water, BP 110/70, HR 92  Orthostatics reviewed and NEG   Pt rested in exam room for some time, we discussed at length calling friend to help take her home, calling EMS to take ehr to ED for further assessment if she did not feel well enough to sit up or walk. With CMA we discussed doing ambulatory pulse ox before she left, however after checking on her the last time, she said she felt better and walked out of clinic not allowing CMA to do ambulatory pulse ox, stating to CMA she didn't want labs or meds or work note - CMA shannon asked to document their exchange since I was not present and when I went to check on her and ask Clotilda what happened this is what she relayed.       Dx may be due to COVID illness, possible Dehydration, anxiety?  Possible dehydration due to inadequate oral intake over several days, accompanied by fever and body aches. Symptoms include dizziness and lightheadedness. Blood pressure and heart rate are stable, but symptomatic when sitting up. - Encourage oral hydration with water. - Consider ER visit if symptoms persist or worsen.    Recording duration: 34 minutes    Spent over 50 min with pt in office/exam room for OV today due to sx, concerns, rechecking etc. She left quickly declining all prior recommendations (See CMA note)      Michelene Cower, PA-C 07/08/24 10:14 AM

## 2024-07-08 NOTE — Progress Notes (Signed)
 When checking on patient after lying down, she was up in the room moving. I inquired if she was okay since she was dizzy and offered lab work for medications, she declined and left the clinic.

## 2024-11-10 ENCOUNTER — Ambulatory Visit: Payer: Self-pay | Admitting: Nurse Practitioner

## 2024-11-11 ENCOUNTER — Ambulatory Visit: Payer: Self-pay | Admitting: Nurse Practitioner
